# Patient Record
Sex: Female | Born: 1970
Health system: Southern US, Community
[De-identification: ages and names within clinical notes are randomized; demographics above are authoritative.]

## PROBLEM LIST (undated history)

## (undated) DIAGNOSIS — E079 Disorder of thyroid, unspecified: Secondary | ICD-10-CM

## (undated) DIAGNOSIS — G43909 Migraine, unspecified, not intractable, without status migrainosus: Secondary | ICD-10-CM

## (undated) DIAGNOSIS — F419 Anxiety disorder, unspecified: Secondary | ICD-10-CM

## (undated) HISTORY — PX: TONSILLECTOMY AND ADENOIDECTOMY: SHX28

## (undated) HISTORY — DX: Migraine, unspecified, not intractable, without status migrainosus: G43.909

## (undated) HISTORY — PX: CYST REMOVAL NECK: SHX6281

## (undated) HISTORY — PX: CHOLECYSTECTOMY: SHX55

## (undated) HISTORY — DX: Anxiety disorder, unspecified: F41.9

## (undated) HISTORY — PX: SKIN CANCER EXCISION: SHX779

## (undated) HISTORY — PX: PELVIC LAPAROSCOPY: SHX162

## (undated) HISTORY — DX: Disorder of thyroid, unspecified: E07.9

---

## 2014-03-23 ENCOUNTER — Other Ambulatory Visit: Payer: Self-pay | Admitting: Primary Care

## 2014-03-23 DIAGNOSIS — Z1231 Encounter for screening mammogram for malignant neoplasm of breast: Secondary | ICD-10-CM

## 2014-04-07 ENCOUNTER — Ambulatory Visit
Admission: RE | Admit: 2014-04-07 | Discharge: 2014-04-07 | Disposition: A | Discharge status: Home / Self Care | Payer: No Typology Code available for payment source | Source: Ambulatory Visit | Attending: Primary Care | Admitting: Primary Care

## 2014-04-07 DIAGNOSIS — Z1231 Encounter for screening mammogram for malignant neoplasm of breast: Secondary | ICD-10-CM

## 2014-04-10 ENCOUNTER — Other Ambulatory Visit: Payer: Self-pay | Admitting: Primary Care

## 2014-04-10 DIAGNOSIS — R928 Other abnormal and inconclusive findings on diagnostic imaging of breast: Secondary | ICD-10-CM

## 2014-04-22 ENCOUNTER — Ambulatory Visit
Admission: RE | Admit: 2014-04-22 | Discharge: 2014-04-22 | Disposition: A | Discharge status: Home / Self Care | Payer: No Typology Code available for payment source | Source: Ambulatory Visit | Attending: Primary Care | Admitting: Primary Care

## 2014-04-22 DIAGNOSIS — R928 Other abnormal and inconclusive findings on diagnostic imaging of breast: Secondary | ICD-10-CM

## 2014-09-17 ENCOUNTER — Other Ambulatory Visit: Payer: Self-pay | Admitting: Primary Care

## 2014-09-17 DIAGNOSIS — R921 Mammographic calcification found on diagnostic imaging of breast: Secondary | ICD-10-CM

## 2014-09-22 ENCOUNTER — Ambulatory Visit (INDEPENDENT_AMBULATORY_CARE_PROVIDER_SITE_OTHER): Payer: 59 | Admitting: Family Medicine

## 2014-09-22 VITALS — BP 122/70 | HR 84 | Temp 97.9°F | Resp 18 | Ht 64.0 in | Wt 173.0 lb

## 2014-09-22 DIAGNOSIS — H9192 Unspecified hearing loss, left ear: Secondary | ICD-10-CM

## 2014-09-22 DIAGNOSIS — H65192 Other acute nonsuppurative otitis media, left ear: Secondary | ICD-10-CM

## 2014-09-22 DIAGNOSIS — R42 Dizziness and giddiness: Secondary | ICD-10-CM

## 2014-09-22 MED ORDER — MECLIZINE HCL 25 MG PO TABS: 25.0000 mg | ORAL_TABLET | Freq: Three times a day (TID) | ORAL | Status: DC | PRN

## 2014-09-22 MED ORDER — AMOXICILLIN 875 MG PO TABS: 875.0000 mg | ORAL_TABLET | Freq: Two times a day (BID) | ORAL | Status: DC

## 2014-09-22 NOTE — Progress Notes (Signed)
° °  Subjective:  This chart was scribed for   by Randa Evens, ED Scribe. This Patient was seen in room 14 and the patients care was started at 4:09 PM   Patient ID: Cassandra Hughes, female    DOB: September 17, 1970, 44 y.o.   MRN: [id number]  Chief Complaint  Patient presents with   clogged ears    x3 days lt ear   Dizziness    x2 days    Emesis    today     HPI HPI Comments: [name] is a 44 y.o. female who presents to the [facility] complaining of new dizziness onset 1 day prior.  Pt states she has intermittent left ear that is" clogged up." Pt states she has some associated vomiting. Pt states that the dizziness is worse with movement. Pt doesn't report any medications PTA. Pt states she does have Hx of vertigo. Pt states that she has some slight cold symptoms but nothing major. Pt denies tinnitus or any other symptoms.   Customer service at [employer]  Review of Systems  HENT: Negative for ear discharge, ear pain and tinnitus.   Gastrointestinal: Positive for vomiting.  Neurological: Positive for dizziness.     Objective:   BP 122/70 mmHg   Pulse 84   Temp(Src) 97.9 F (36.6 C) (Oral)   Resp 18   Ht 5\' 4"  (1.626 m)   Wt 173 lb (78.472 kg)   BMI 29.68 kg/m2   SpO2 99%   LMP 09/20/2014   Physical Exam  Constitutional: She is oriented to person, place, and time. She appears well-developed and well-nourished. No distress.  HENT:  Head: Normocephalic and atraumatic.  Left TM amber and retracted.    Eyes: Conjunctivae and EOM are normal.  Neck: Neck supple. No tracheal deviation present.  Cardiovascular: Normal rate.   Pulmonary/Chest: Effort normal. No respiratory distress.  Musculoskeletal: Normal range of motion.  Neurological: She is alert and oriented to person, place, and time. Gait normal.  Skin: Skin is warm and dry.  Psychiatric: She has a normal mood and affect. Her behavior is normal.  Nursing note and vitals reviewed.   Assessment  & Plan:   I personally performed the services described in this documentation, which was scribed in my presence. The recorded information has been reviewed and is accurate. This chart was scribed in my presence and reviewed by me personally.    ICD-9-CM ICD-10-CM   1. Acute nonsuppurative otitis media of left ear 381.00 H65.192 amoxicillin (AMOXIL) 875 MG tablet  2. Hearing loss, left 389.9 H91.92 amoxicillin (AMOXIL) 875 MG tablet  3. Vertigo 780.4 R42 meclizine (ANTIVERT) 25 MG tablet     Signed, Robyn Haber, MD

## 2014-10-14 ENCOUNTER — Ambulatory Visit
Admission: RE | Admit: 2014-10-14 | Discharge: 2014-10-14 | Disposition: A | Discharge status: Home / Self Care | Payer: 59 | Source: Ambulatory Visit | Attending: Primary Care | Admitting: Primary Care

## 2014-10-14 DIAGNOSIS — R921 Mammographic calcification found on diagnostic imaging of breast: Secondary | ICD-10-CM

## 2015-03-17 ENCOUNTER — Other Ambulatory Visit: Payer: Self-pay

## 2015-03-17 ENCOUNTER — Other Ambulatory Visit: Payer: Self-pay | Admitting: Family Medicine

## 2015-03-17 DIAGNOSIS — R921 Mammographic calcification found on diagnostic imaging of breast: Secondary | ICD-10-CM

## 2015-04-09 ENCOUNTER — Ambulatory Visit
Admission: RE | Admit: 2015-04-09 | Discharge: 2015-04-09 | Disposition: A | Discharge status: Home / Self Care | Payer: 59 | Source: Ambulatory Visit

## 2015-04-09 DIAGNOSIS — R921 Mammographic calcification found on diagnostic imaging of breast: Secondary | ICD-10-CM

## 2015-04-11 ENCOUNTER — Telehealth: Payer: Self-pay | Admitting: Family Medicine

## 2015-04-11 DIAGNOSIS — E038 Other specified hypothyroidism: Secondary | ICD-10-CM

## 2015-04-11 NOTE — Telephone Encounter (Signed)
Patient returned call about some imaging results. Gave her this message from Dr. Joseph Art "Notes Recorded by Robyn Haber, MD on 04/09/2015 at 2:57 PM Please inform patient of normal result"   Patient understood. She also wants to get a referral to endocrinology.   [phone]

## 2015-04-12 NOTE — Telephone Encounter (Signed)
Ok for referral?

## 2015-04-12 NOTE — Addendum Note (Signed)
Addended by: Robyn Haber on: 04/12/2015 02:39 PM   Modules accepted: Orders

## 2015-04-20 ENCOUNTER — Telehealth: Payer: Self-pay

## 2015-04-20 NOTE — Telephone Encounter (Signed)
Pt LM about mammogram results. Looks like she's been told about these twice. LM letting her know it was normal and to CB with any questions.

## 2015-10-15 ENCOUNTER — Encounter: Payer: Self-pay | Admitting: Women's Health

## 2015-10-15 ENCOUNTER — Telehealth: Payer: Self-pay | Admitting: *Deleted

## 2015-10-15 ENCOUNTER — Ambulatory Visit (INDEPENDENT_AMBULATORY_CARE_PROVIDER_SITE_OTHER): Payer: BLUE CROSS/BLUE SHIELD | Admitting: Women's Health

## 2015-10-15 VITALS — BP 118/76 | Ht 64.0 in | Wt 174.0 lb

## 2015-10-15 DIAGNOSIS — Z01419 Encounter for gynecological examination (general) (routine) without abnormal findings: Secondary | ICD-10-CM

## 2015-10-15 DIAGNOSIS — Z833 Family history of diabetes mellitus: Secondary | ICD-10-CM | POA: Diagnosis not present

## 2015-10-15 DIAGNOSIS — N938 Other specified abnormal uterine and vaginal bleeding: Secondary | ICD-10-CM | POA: Diagnosis not present

## 2015-10-15 DIAGNOSIS — Z13220 Encounter for screening for lipoid disorders: Secondary | ICD-10-CM

## 2015-10-15 DIAGNOSIS — Z800 Family history of malignant neoplasm of digestive organs: Secondary | ICD-10-CM

## 2015-10-15 LAB — CBC WITH DIFFERENTIAL/PLATELET
Basophils Absolute: 0.1 10*3/uL (ref 0.0–0.1)
Basophils Relative: 3 % — ABNORMAL HIGH (ref 0–1)
EOS PCT: 4 % (ref 0–5)
Eosinophils Absolute: 0.2 10*3/uL (ref 0.0–0.7)
HCT: 35.8 % — ABNORMAL LOW (ref 36.0–46.0)
HEMOGLOBIN: 11.2 g/dL — AB (ref 12.0–15.0)
Lymphocytes Relative: 44 % (ref 12–46)
Lymphs Abs: 1.7 10*3/uL (ref 0.7–4.0)
MCH: 24.7 pg — AB (ref 26.0–34.0)
MCHC: 31.3 g/dL (ref 30.0–36.0)
MCV: 79 fL (ref 78.0–100.0)
MONOS PCT: 10 % (ref 3–12)
MPV: 10.1 fL (ref 8.6–12.4)
Monocytes Absolute: 0.4 10*3/uL (ref 0.1–1.0)
Neutro Abs: 1.5 10*3/uL — ABNORMAL LOW (ref 1.7–7.7)
Neutrophils Relative %: 39 % — ABNORMAL LOW (ref 43–77)
Platelets: 226 10*3/uL (ref 150–400)
RBC: 4.53 MIL/uL (ref 3.87–5.11)
RDW: 15.4 % (ref 11.5–15.5)
WBC: 3.8 10*3/uL — ABNORMAL LOW (ref 4.0–10.5)

## 2015-10-15 LAB — LIPID PANEL
CHOL/HDL RATIO: 4 ratio (ref <=5.0)
Cholesterol: 222 mg/dL — ABNORMAL HIGH (ref 125–200)
HDL: 55 mg/dL (ref >=46)
LDL Cholesterol: 151 mg/dL — ABNORMAL HIGH (ref <130)
Triglycerides: 80 mg/dL (ref <150)
VLDL: 16 mg/dL (ref <30)

## 2015-10-15 LAB — GLUCOSE, RANDOM: GLUCOSE: 84 mg/dL (ref 65–99)

## 2015-10-15 NOTE — Patient Instructions (Addendum)
hHealth Maintenance, Female Adopting a healthy lifestyle and getting preventive care can go a long way to promote health and wellness. Talk with your health care provider about what schedule of regular examinations is right for you. This is a good chance for you to check in with your provider about disease prevention and staying healthy. In between checkups, there are plenty of things you can do on your own. Experts have done a lot of research about which lifestyle changes and preventive measures are most likely to keep you healthy. Ask your health care provider for more information. WEIGHT AND DIET  Eat a healthy diet  Be sure to include plenty of vegetables, fruits, low-fat dairy products, and lean protein.  Do not eat a lot of foods high in solid fats, added sugars, or salt.  Get regular exercise. This is one of the most important things you can do for your health.  Most adults should exercise for at least 150 minutes each week. The exercise should increase your heart rate and make you sweat (moderate-intensity exercise).  Most adults should also do strengthening exercises at least twice a week. This is in addition to the moderate-intensity exercise.  Maintain a healthy weight  Body mass index (BMI) is a measurement that can be used to identify possible weight problems. It estimates body fat based on height and weight. Your health care provider can help determine your BMI and help you achieve or maintain a healthy weight.  For females 43 years of age and older:   A BMI below 18.5 is considered underweight.  A BMI of 18.5 to 24.9 is normal.  A BMI of 25 to 29.9 is considered overweight.  A BMI of 30 and above is considered obese.  Watch levels of cholesterol and blood lipids  You should start having your blood tested for lipids and cholesterol at 45 years of age, then have this test every 5 years.  You may need to have your cholesterol levels checked more often if:  Your lipid  or cholesterol levels are high.  You are older than 45 years of age.  You are at high risk for heart disease.  CANCER SCREENING   Lung Cancer  Lung cancer screening is recommended for adults 39-101 years old who are at high risk for lung cancer because of a history of smoking.  A yearly low-dose CT scan of the lungs is recommended for people who:  Currently smoke.  Have quit within the past 15 years.  Have at least a 30-pack-year history of smoking. A pack year is smoking an average of one pack of cigarettes a day for 1 year.  Yearly screening should continue until it has been 15 years since you quit.  Yearly screening should stop if you develop a health problem that would prevent you from having lung cancer treatment.  Breast Cancer  Practice breast self-awareness. This means understanding how your breasts normally appear and feel.  It also means doing regular breast self-exams. Let your health care provider know about any changes, no matter how small.  If you are in your 20s or 30s, you should have a clinical breast exam (CBE) by a health care provider every 1-3 years as part of a regular health exam.  If you are 5 or older, have a CBE every year. Also consider having a breast X-ray (mammogram) every year.  If you have a family history of breast cancer, talk to your health care provider about genetic screening.  If you  are at high risk for breast cancer, talk to your health care provider about having an MRI and a mammogram every year.  Breast cancer gene (BRCA) assessment is recommended for women who have family members with BRCA-related cancers. BRCA-related cancers include:  Breast.  Ovarian.  Tubal.  Peritoneal cancers.  Results of the assessment will determine the need for genetic counseling and BRCA1 and BRCA2 testing. Cervical Cancer Your health care provider may recommend that you be screened regularly for cancer of the pelvic organs (ovaries, uterus, and  vagina). This screening involves a pelvic examination, including checking for microscopic changes to the surface of your cervix (Pap test). You may be encouraged to have this screening done every 3 years, beginning at age 21.  For women ages 30-65, health care providers may recommend pelvic exams and Pap testing every 3 years, or they may recommend the Pap and pelvic exam, combined with testing for human papilloma virus (HPV), every 5 years. Some types of HPV increase your risk of cervical cancer. Testing for HPV may also be done on women of any age with unclear Pap test results.  Other health care providers may not recommend any screening for nonpregnant women who are considered low risk for pelvic cancer and who do not have symptoms. Ask your health care provider if a screening pelvic exam is right for you.  If you have had past treatment for cervical cancer or a condition that could lead to cancer, you need Pap tests and screening for cancer for at least 20 years after your treatment. If Pap tests have been discontinued, your risk factors (such as having a new sexual partner) need to be reassessed to determine if screening should resume. Some women have medical problems that increase the chance of getting cervical cancer. In these cases, your health care provider may recommend more frequent screening and Pap tests. Colorectal Cancer  This type of cancer can be detected and often prevented.  Routine colorectal cancer screening usually begins at 45 years of age and continues through 45 years of age.  Your health care provider may recommend screening at an earlier age if you have risk factors for colon cancer.  Your health care provider may also recommend using home test kits to check for hidden blood in the stool.  A small camera at the end of a tube can be used to examine your colon directly (sigmoidoscopy or colonoscopy). This is done to check for the earliest forms of colorectal  cancer.  Routine screening usually begins at age 50.  Direct examination of the colon should be repeated every 5-10 years through 45 years of age. However, you may need to be screened more often if early forms of precancerous polyps or small growths are found. Skin Cancer  Check your skin from head to toe regularly.  Tell your health care provider about any new moles or changes in moles, especially if there is a change in a mole's shape or color.  Also tell your health care provider if you have a mole that is larger than the size of a pencil eraser.  Always use sunscreen. Apply sunscreen liberally and repeatedly throughout the day.  Protect yourself by wearing long sleeves, pants, a wide-brimmed hat, and sunglasses whenever you are outside. HEART DISEASE, DIABETES, AND HIGH BLOOD PRESSURE   High blood pressure causes heart disease and increases the risk of stroke. High blood pressure is more likely to develop in:  People who have blood pressure in the high end   of the normal range (130-139/85-89 mm Hg).  People who are overweight or obese.  People who are African American.  If you are 38-23 years of age, have your blood pressure checked every 3-5 years. If you are 61 years of age or older, have your blood pressure checked every year. You should have your blood pressure measured twice--once when you are at a hospital or clinic, and once when you are not at a hospital or clinic. Record the average of the two measurements. To check your blood pressure when you are not at a hospital or clinic, you can use:  An automated blood pressure machine at a pharmacy.  A home blood pressure monitor.  If you are between 45 years and 39 years old, ask your health care provider if you should take aspirin to prevent strokes.  Have regular diabetes screenings. This involves taking a blood sample to check your fasting blood sugar level.  If you are at a normal weight and have a low risk for diabetes,  have this test once every three years after 45 years of age.  If you are overweight and have a high risk for diabetes, consider being tested at a younger age or more often. PREVENTING INFECTION  Hepatitis B  If you have a higher risk for hepatitis B, you should be screened for this virus. You are considered at high risk for hepatitis B if:  You were born in a country where hepatitis B is common. Ask your health care provider which countries are considered high risk.  Your parents were born in a high-risk country, and you have not been immunized against hepatitis B (hepatitis B vaccine).  You have HIV or AIDS.  You use needles to inject street drugs.  You live with someone who has hepatitis B.  You have had sex with someone who has hepatitis B.  You get hemodialysis treatment.  You take certain medicines for conditions, including cancer, organ transplantation, and autoimmune conditions. Hepatitis C  Blood testing is recommended for:  Everyone born from [birth year] through 1965.  Anyone with known risk factors for hepatitis C. Sexually transmitted infections (STIs)  You should be screened for sexually transmitted infections (STIs) including gonorrhea and chlamydia if:  You are sexually active and are younger than 45 years of age.  You are older than 45 years of age and your health care provider tells you that you are at risk for this type of infection.  Your sexual activity has changed since you were last screened and you are at an increased risk for chlamydia or gonorrhea. Ask your health care provider if you are at risk.  If you do not have HIV, but are at risk, it may be recommended that you take a prescription medicine daily to prevent HIV infection. This is called pre-exposure prophylaxis (PrEP). You are considered at risk if:  You are sexually active and do not regularly use condoms or know the HIV status of your partner(s).  You take drugs by injection.  You are sexually  active with a partner who has HIV. Talk with your health care provider about whether you are at high risk of being infected with HIV. If you choose to begin PrEP, you should first be tested for HIV. You should then be tested every 3 months for as long as you are taking PrEP.  PREGNANCY   If you are premenopausal and you may become pregnant, ask your health care provider about preconception counseling.  If you may  become pregnant, take 400 to 800 micrograms (mcg) of folic acid every day.  If you want to prevent pregnancy, talk to your health care provider about birth control (contraception). OSTEOPOROSIS AND MENOPAUSE   Osteoporosis is a disease in which the bones lose minerals and strength with aging. This can result in serious bone fractures. Your risk for osteoporosis can be identified using a bone density scan.  If you are 88 years of age or older, or if you are at risk for osteoporosis and fractures, ask your health care provider if you should be screened.  Ask your health care provider whether you should take a calcium or vitamin D supplement to lower your risk for osteoporosis.  Menopause may have certain physical symptoms and risks.  Hormone replacement therapy may reduce some of these symptoms and risks. Talk to your health care provider about whether hormone replacement therapy is right for you.  HOME CARE INSTRUCTIONS   Schedule regular health, dental, and eye exams.  Stay current with your immunizations.   Do not use any tobacco products including cigarettes, chewing tobacco, or electronic cigarettes.  If you are pregnant, do not drink alcohol.  If you are breastfeeding, limit how much and how often you drink alcohol.  Limit alcohol intake to no more than 1 drink per day for nonpregnant women. One drink equals 12 ounces of beer, 5 ounces of wine, or 1 ounces of hard liquor.  Do not use street drugs.  Do not share needles.  Ask your health care provider for help if  you need support or information about quitting drugs.  Tell your health care provider if you often feel depressed.  Tell your health care provider if you have ever been abused or do not feel safe at home.   This information is not intended to replace advice given to you by your health care provider. Make sure you discuss any questions you have with your health care provider.   Document Released: 02/13/2011 Document Revised: 08/21/2014 Document Reviewed: 07/02/2013 Elsevier Interactive Patient Education 2016 [employer]. Sonohysterogram A sonohysterogram is a procedure to examine the inside of your uterus. This exam uses sound waves sent to a computer to make real-time pictures of the inside of your uterus. To get the best images, a germ-free, saltwater solution (sterile saline) is injected into your uterus through your vagina. A sonohysterogram can show whether there is scarring or abnormal growths inside your uterus. It can also show whether your uterus is an abnormal shape or whether the lining is too thin.  LET [facility] CARE PROVIDER KNOW ABOUT:  Any allergies you have.  All medicines you are taking, including vitamins, herbs, eyedrops, creams, and over-the-counter medicines.  Previous problems you or members of your family have had with the use of anesthetics.  Any blood disorders you have.  Previous surgeries you have had.  Medical conditions you have.  The dates of your last period.  Possibility of pregnancy. RISKS AND COMPLICATIONS Generally, a sonohysterogram is a safe procedure. However, as with any procedure, problems can occur. Possible problems include:  Bleeding.  Infection. BEFORE THE PROCEDURE  Your health care provider may have you take an over-the-counter pain medicine.  You may get a prescription for antibiotic medicine.  Your health care provider may give you a pregnancy test before the procedure.  You will empty your bladder. PROCEDURE   You  will lie down on the examining table with your knees raised or your feet in stirrups.  Your  health care provider may do a pelvic exam before starting the procedure.  A slender, handheld device (transducer) will be lubricated and placed into your vagina.  The transducer will be positioned to send sound waves to your uterus.  The sound waves will bounce back to the transducer. They will be sent to a computer.  The computer will turn the sound waves into live images.  Your health care provider will view the images on a screen during the procedure.  Your health care provider will remove the transducer from your vagina and use an instrument to widen the opening (speculum).  A swab will be used to clean the opening to your uterus (cervix).  A long, thin tube (catheter) will then be placed through your cervix into your uterus.  Your health care provider will fill your uterus with sterile saline through the catheter. You may feel some cramping.  The speculum will be removed.  The transducer will be placed back in your vagina to take more images. AFTER THE PROCEDURE After the procedure, it is typical to have light bleeding from your vagina, cramping, and watery vaginal discharge.    This information is not intended to replace advice given to you by your health care provider. Make sure you discuss any questions you have with your health care provider.   Document Released: 12/15/2013 Document Reviewed: 12/15/2013 Elsevier Interactive Patient Education [employer].

## 2015-10-15 NOTE — Telephone Encounter (Signed)
-----   Message from Cassandra Cote, NP sent at 10/15/2015 10:02 AM EST ----- Anderson Malta, patient is off work on Monday, March 13, is there anyway we can get her scheduled for a surgical consult to discuss possible cholecystectomy, mother and maternal grandmother both died of gallbladder cancer mother age 45, maternal grandmother age 3.

## 2015-10-15 NOTE — Telephone Encounter (Signed)
Notes faxed to [facility], they will contact pt to schedule and fax me back with time and date.

## 2015-10-15 NOTE — Progress Notes (Signed)
Autumne Hair 31-Aug-1970 [id number]    History:    Presents for annual exam.  New patient with several issues. Monthly cycle first 2-3 days heavy and then light bleeding and spotting for 2 weeks after. Change within the last year prior to that monthly less than 7 day cycles. Has not been sexually active in greater than 5 years. Normal mammogram history. Reports positive high risk HPV any years ago, no needed treatment. Mother died from gallbladder cancer age 61, died within 74 months of diagnosis, maternal grandmother died from gallbladder cancer age 48.  Past medical history, past surgical history, family history and social history were all reviewed and documented in the EPIC chart. Works at [employer]. Has a 76-year-old son doing well. Father prostate cancer survivor. 2 sisters healthy. Moved here from [place].  ROS:  A ROS was performed and pertinent positives and negatives are included.  Exam:  Filed Vitals:   10/15/15 0854  BP: 118/76    General appearance:  Normal Thyroid:  Symmetrical, normal in size, without palpable masses or nodularity. Respiratory  Auscultation:  Clear without wheezing or rhonchi Cardiovascular  Auscultation:  Regular rate, without rubs, murmurs or gallops  Edema/varicosities:  Not grossly evident Abdominal  Soft,nontender, without masses, guarding or rebound.  Liver/spleen:  No organomegaly noted  Hernia:  None appreciated  Skin  Inspection:  Grossly normal   Breasts: Examined lying and sitting.     Right: Without masses, retractions, discharge or axillary adenopathy.     Left: Without masses, retractions, discharge or axillary adenopathy. Gentitourinary   Inguinal/mons:  Normal without inguinal adenopathy  External genitalia:  Normal  BUS/Urethra/Skene's glands:  Normal  Vagina:  Normal Brown discharge without odor or erythema  Cervix:  Normal  Uterus:  normal in size, shape and contour.  Midline and mobile  Adnexa/parametria:     Rt: Without  masses or tenderness.   Lt: Without masses or tenderness.  Anus and perineum: Normal  Digital rectal exam: Normal sphincter tone without palpated masses or tenderness  Assessment/Plan:  45 y.o. S WF G1 P1 for annual exam.    Cycles monthly for 2-3 days heavy flow and then light bleeding/spotting for 2 weeks after change in the past year Significant family history of gallbladder cancer mother and maternal grandmother Hypothyroidism-endocrinologist manages  Plan: Sonohysterogram with Dr. Toney Rakes, schedule after next cycle. SBE's, continue annual screening mammogram 3-D tomography reviewed and encouraged history of dense breasts. Exercise, calcium rich diet, vitamin D 1000 daily, decrease carbs in diet for weight loss encouraged. Will schedule surgical consult to discuss possible cholecystectomy due to strong family history. Ultrasound reviewed and declined need, condoms if active or return to office for management. CBC, lipid panel, glucose, UA, Pap with HR HPV typing, new screening guidelines reviewed.    Huel Cote [facility], 9:53 AM 10/15/2015

## 2015-10-18 LAB — PAP IG AND HPV HIGH-RISK: HPV DNA High Risk: NOT DETECTED

## 2015-10-22 NOTE — Telephone Encounter (Signed)
Appointment 10/25/15 @ 9:20am with Dr.Ramirez

## 2015-10-25 ENCOUNTER — Other Ambulatory Visit: Payer: Self-pay | Admitting: General Surgery

## 2015-10-25 DIAGNOSIS — Z800 Family history of malignant neoplasm of digestive organs: Secondary | ICD-10-CM

## 2015-11-03 ENCOUNTER — Ambulatory Visit
Admission: RE | Admit: 2015-11-03 | Discharge: 2015-11-03 | Disposition: A | Discharge status: Home / Self Care | Payer: BLUE CROSS/BLUE SHIELD | Source: Ambulatory Visit | Attending: General Surgery | Admitting: General Surgery

## 2015-11-03 DIAGNOSIS — Z800 Family history of malignant neoplasm of digestive organs: Secondary | ICD-10-CM

## 2016-01-13 HISTORY — PX: CHOLECYSTECTOMY, LAPAROSCOPIC: SHX56

## 2016-02-17 ENCOUNTER — Other Ambulatory Visit: Payer: Self-pay | Admitting: Gynecology

## 2016-02-17 DIAGNOSIS — N939 Abnormal uterine and vaginal bleeding, unspecified: Secondary | ICD-10-CM

## 2016-03-06 ENCOUNTER — Other Ambulatory Visit: Payer: Self-pay | Admitting: Women's Health

## 2016-03-06 DIAGNOSIS — R921 Mammographic calcification found on diagnostic imaging of breast: Secondary | ICD-10-CM

## 2016-03-20 ENCOUNTER — Ambulatory Visit: Payer: BLUE CROSS/BLUE SHIELD | Admitting: Gynecology

## 2016-03-20 ENCOUNTER — Other Ambulatory Visit: Payer: BLUE CROSS/BLUE SHIELD

## 2016-05-08 ENCOUNTER — Ambulatory Visit
Admission: RE | Admit: 2016-05-08 | Discharge: 2016-05-08 | Disposition: A | Discharge status: Home / Self Care | Payer: BLUE CROSS/BLUE SHIELD | Source: Ambulatory Visit | Attending: Women's Health | Admitting: Women's Health

## 2016-05-08 ENCOUNTER — Ambulatory Visit (INDEPENDENT_AMBULATORY_CARE_PROVIDER_SITE_OTHER): Payer: BLUE CROSS/BLUE SHIELD | Admitting: Family Medicine

## 2016-05-08 ENCOUNTER — Other Ambulatory Visit: Payer: Self-pay | Admitting: Women's Health

## 2016-05-08 ENCOUNTER — Encounter: Payer: Self-pay | Admitting: Women's Health

## 2016-05-08 VITALS — BP 120/72 | HR 70 | Temp 98.4°F | Resp 18 | Ht 64.0 in | Wt 182.0 lb

## 2016-05-08 DIAGNOSIS — R921 Mammographic calcification found on diagnostic imaging of breast: Secondary | ICD-10-CM

## 2016-05-08 DIAGNOSIS — R42 Dizziness and giddiness: Secondary | ICD-10-CM

## 2016-05-08 DIAGNOSIS — R51 Headache: Secondary | ICD-10-CM

## 2016-05-08 DIAGNOSIS — R519 Headache, unspecified: Secondary | ICD-10-CM

## 2016-05-08 MED ORDER — MECLIZINE HCL 25 MG PO TABS: 25.0000 mg | ORAL_TABLET | Freq: Three times a day (TID) | ORAL | 0 refills | Status: DC | PRN

## 2016-05-08 NOTE — Progress Notes (Signed)
Patient ID: Cassandra Hughes, female    DOB: 02-23-1971, 45 y.o.   MRN: [id number]  PCP: No primary care provider on file.  Chief Complaint  Patient presents with  . Dizziness    5 days    Subjective:   HPI 45 year old presents for evaluation of dizziness. She has experienced vertigo intermittently for several years. 1 week ago she sat up first thing in the morning and everything started spinning around and moving in the room. Positional movements causes sudden onset of spinning. Denies nausea or sensation of ear fullness.  She reports increase pressure around the frontal sinus region without runny nose. She has taken dramamine for motion sickness with minimal relief. She reports dramamine caused increase sleepiness the following day.  Also requests a referral to headache clinic related to long history of migraines that she reports has worsened over the last few years.  Social History   Social History  . Marital status: Single    Spouse name: N/A  . Number of children: N/A  . Years of education: N/A   Occupational History  . Not on file.   Social History Main Topics  . Smoking status: Never Smoker  . Smokeless tobacco: Never Used  . Alcohol use 0.0 oz/week     Comment: occ  . Drug use: No  . Sexual activity: No   Other Topics Concern  . Not on file   Social History Narrative  . No narrative on file   Family History  Problem Relation Age of Onset  . Cancer Mother     gallbladder   . Cancer Father     prostate  . Cancer Maternal Grandmother   . Heart disease Sister    Review of Systems  See HPI   Patient Active Problem List   Diagnosis Date Noted  . Family history of cancer of gallbladder 10/15/2015     Prior to Admission medications   Medication Sig Start Date End Date Taking? Authorizing Provider  levothyroxine (SYNTHROID, LEVOTHROID) 137 MCG tablet Take 137 mcg by mouth daily before breakfast.   Yes Historical Provider, MD  meclizine (ANTIVERT) 25  MG tablet Take 1 tablet (25 mg total) by mouth 3 (three) times daily as needed for dizziness. Patient not taking: Reported on 05/08/2016 09/22/14   Robyn Haber, MD    Allergies  Allergen Reactions  . Vicodin [Hydrocodone-Acetaminophen]       Objective:  Physical Exam  Constitutional: She is oriented to person, place, and time. She appears well-developed and well-nourished.  HENT:  Head: Normocephalic and atraumatic.  Right Ear: External ear normal.  Nose: Nose normal.  Mouth/Throat: Oropharynx is clear and moist.  Eyes: Conjunctivae and EOM are normal. Pupils are equal, round, and reactive to light.  Neck: Normal range of motion. Neck supple.  Cardiovascular: Normal rate, regular rhythm, normal heart sounds and intact distal pulses.   Pulmonary/Chest: Effort normal and breath sounds normal.  Musculoskeletal: Normal range of motion.  Neurological: She is alert and oriented to person, place, and time.  Positive Dix-Hallpike test. Negative nystagmus. Cerebellar function intact. Negative Romberg test.  Skin: Skin is warm and dry.  Psychiatric: She has a normal mood and affect. Her behavior is normal. Judgment and thought content normal.   Vitals:   05/08/16 1210  BP: 120/72  Pulse: 70  Resp: 18  Temp: 98.4 F (36.9 C)   Assessment & Plan:  1. Vertigo, likely benign positional vertigo Plan: - meclizine (ANTIVERT) 25 MG tablet; Take  1 tablet (25 mg total) by mouth 3 (three) times daily as needed for dizziness.  Dispense: 30 tablet; Refill: 0 - PT Vestibular rehabilitation   2. Worsening headaches, patient requested referral at the end of visit due to what she describes as more frequently occurring and increased intensity of headaches that she's experienced for several years. Plan: - AMB referral to headache clinic  Follow-up as needed.  Carroll Sage. Kenton Kingfisher, MSN, FNP-C [facility]

## 2016-05-08 NOTE — Patient Instructions (Addendum)
Start Meclizine  25 mg three times per day as needed for vertigo.  Start cetrizine 10 mg at bedtime as needed for head congestions.  Vestibular rehab referral ordered.  They will contact you to schedule an appointment.  IF you received an x-ray today, you will receive an invoice from [facility]. Please contact [facility] at [phone] with questions or concerns regarding your invoice.   IF you received labwork today, you will receive an invoice from [employer]. Please contact [facility] at [phone] with questions or concerns regarding your invoice.   Our billing staff will not be able to assist you with questions regarding bills from these companies.  You will be contacted with the lab results as soon as they are available. The fastest way to get your results is to activate your My Chart account. Instructions are located on the last page of this paperwork. If you have not heard from [place] regarding the results in 2 weeks, please contact this office.     Benign Positional Vertigo Vertigo is the feeling that you or your surroundings are moving when they are not. Benign positional vertigo is the most common form of vertigo. The cause of this condition is not serious (is benign). This condition is triggered by certain movements and positions (is positional). This condition can be dangerous if it occurs while you are doing something that could endanger you or others, such as driving.  CAUSES In many cases, the cause of this condition is not known. It may be caused by a disturbance in an area of the inner ear that helps your brain to sense movement and balance. This disturbance can be caused by a viral infection (labyrinthitis), head injury, or repetitive motion. RISK FACTORS This condition is more likely to develop in:  Women.  People who are 59 years of age or older. SYMPTOMS Symptoms of this condition usually happen when you move your head or  your eyes in different directions. Symptoms may start suddenly, and they usually last for less than a minute. Symptoms may include:  Loss of balance and falling.  Feeling like you are spinning or moving.  Feeling like your surroundings are spinning or moving.  Nausea and vomiting.  Blurred vision.  Dizziness.  Involuntary eye movement (nystagmus). Symptoms can be mild and cause only slight annoyance, or they can be severe and interfere with daily life. Episodes of benign positional vertigo may return (recur) over time, and they may be triggered by certain movements. Symptoms may improve over time. DIAGNOSIS This condition is usually diagnosed by medical history and a physical exam of the head, neck, and ears. You may be referred to a health care provider who specializes in ear, nose, and throat (ENT) problems (otolaryngologist) or a provider who specializes in disorders of the nervous system (neurologist). You may have additional testing, including:  MRI.  A CT scan.  Eye movement tests. Your health care provider may ask you to change positions quickly while he or she watches you for symptoms of benign positional vertigo, such as nystagmus. Eye movement may be tested with an electronystagmogram (ENG), caloric stimulation, the Dix-Hallpike test, or the roll test.  An electroencephalogram (EEG). This records electrical activity in your brain.  Hearing tests. TREATMENT Usually, your health care provider will treat this by moving your head in specific positions to adjust your inner ear back to normal. Surgery may be needed in severe cases, but this is rare. In some cases, benign positional vertigo may resolve  on its own in 2-4 weeks. HOME CARE INSTRUCTIONS Safety  Move slowly.Avoid sudden body or head movements.  Avoid driving.  Avoid operating heavy machinery.  Avoid doing any tasks that would be dangerous to you or others if a vertigo episode would occur.  If you have trouble  walking or keeping your balance, try using a cane for stability. If you feel dizzy or unstable, sit down right away.  Return to your normal activities as told by your health care provider. Ask your health care provider what activities are safe for you. General Instructions  Take over-the-counter and prescription medicines only as told by your health care provider.  Avoid certain positions or movements as told by your health care provider.  Drink enough fluid to keep your urine clear or pale yellow.  Keep all follow-up visits as told by your health care provider. This is important. SEEK MEDICAL CARE IF:  You have a fever.  Your condition gets worse or you develop new symptoms.  Your family or friends notice any behavioral changes.  Your nausea or vomiting gets worse.  You have numbness or a "pins and needles" sensation. SEEK IMMEDIATE MEDICAL CARE IF:  You have difficulty speaking or moving.  You are always dizzy.  You faint.  You develop severe headaches.  You have weakness in your legs or arms.  You have changes in your hearing or vision.  You develop a stiff neck.  You develop sensitivity to light.   This information is not intended to replace advice given to you by your health care provider. Make sure you discuss any questions you have with your health care provider.   Document Released: 05/08/2006 Document Revised: 04/21/2015 Document Reviewed: 11/23/2014 Elsevier Interactive Patient Education [employer].

## 2016-05-29 ENCOUNTER — Telehealth: Payer: Self-pay

## 2016-05-29 NOTE — Telephone Encounter (Signed)
I see a referral placed by kim on 9/25 and a note from [facility] "Sent to [facility]", is this the case?

## 2016-05-29 NOTE — Telephone Encounter (Signed)
PATIENT STATES SHE SAW KIMBERLY HARRIS ON SEPT. 25, 2017 FOR VERTIGO. SHE WAS GOING TO PUT IN A REFERRAL FOR HER TO GO TO VESTIBULAR REHAB, BUT SHE HAS NEVER HEARD ANYTHING BACK FROM [place]. (OUR REFERRAL DEPARTMENT CHECKED FOR THE ORDER AND IT WAS NEVER PUT INTO THE COMPUTER). PATIENT STATES SHE GOT THE REFERRAL FOR HER HEADACHES, BUT THE VERTIGO WAS THE INITIAL REASON SHE CAME HERE. BEST PHONE [phone] (CELL)  [place]

## 2016-05-30 NOTE — Telephone Encounter (Signed)
I will place a referral to vestibular rehab for the patient.  Thanks,  Molli Barrows, FNP-C

## 2016-05-30 NOTE — Telephone Encounter (Signed)
Yes, the headache clinic referral has been placed. We will need a new referral placed for vestibular rehab. Thanks!

## 2016-06-02 NOTE — Telephone Encounter (Signed)
Left detailed message for patient.

## 2016-06-19 ENCOUNTER — Ambulatory Visit (INDEPENDENT_AMBULATORY_CARE_PROVIDER_SITE_OTHER): Payer: BLUE CROSS/BLUE SHIELD | Admitting: Physician Assistant

## 2016-06-19 VITALS — BP 120/76 | HR 80 | Temp 98.4°F | Resp 16 | Ht 65.0 in | Wt 179.0 lb

## 2016-06-19 DIAGNOSIS — R42 Dizziness and giddiness: Secondary | ICD-10-CM

## 2016-06-19 MED ORDER — AZELASTINE HCL 0.15 % NA SOLN: 2.0000 | Freq: Two times a day (BID) | NASAL | 0 refills | Status: DC

## 2016-06-19 NOTE — Patient Instructions (Addendum)
   IF you received an x-ray today, you will receive an invoice from [facility]. Please contact [facility] at [phone] with questions or concerns regarding your invoice.   IF you received labwork today, you will receive an invoice from [facility]. Please contact [facility] at [phone] with questions or concerns regarding your invoice.   Our billing staff will not be able to assist you with questions regarding bills from these companies.  You will be contacted with the lab results as soon as they are available. The fastest way to get your results is to activate your My Chart account. Instructions are located on the last page of this paperwork. If you have not heard from us regarding the results in 2 weeks, please contact this office.     Epley Maneuver Self-Care WHAT IS THE EPLEY MANEUVER? The Epley maneuver is an exercise you can do to relieve symptoms of benign paroxysmal positional vertigo (BPPV). This condition is often just referred to as vertigo. BPPV is caused by the movement of tiny crystals (canaliths) inside your inner ear. The accumulation and movement of canaliths in your inner ear causes a sudden spinning sensation (vertigo) when you move your head to certain positions. Vertigo usually lasts about 30 seconds. BPPV usually occurs in just one ear. If you get vertigo when you lie on your left side, you probably have BPPV in your left ear. Your health care provider can tell you which ear is involved.  BPPV may be caused by a head injury. Many people older than 50 get BPPV for unknown reasons. If you have been diagnosed with BPPV, your health care provider may teach you how to do this maneuver. BPPV is not life threatening (benign) and usually goes away in time.  WHEN SHOULD I PERFORM THE EPLEY MANEUVER? You can do this maneuver at home whenever you have symptoms of vertigo. You may do the Epley maneuver up to 3 times a day until your  symptoms of vertigo go away. HOW SHOULD I DO THE EPLEY MANEUVER? 1. Sit on the edge of a bed or table with your back straight. Your legs should be extended or hanging over the edge of the bed or table.  2. Turn your head halfway toward the affected ear.  3. Lie backward quickly with your head turned until you are lying flat on your back. You may want to position a pillow under your shoulders.  4. Hold this position for 30 seconds. You may experience an attack of vertigo. This is normal. Hold this position until the vertigo stops. 5. Then turn your head to the opposite direction until your unaffected ear is facing the floor.  6. Hold this position for 30 seconds. You may experience an attack of vertigo. This is normal. Hold this position until the vertigo stops. 7. Now turn your whole body to the same side as your head. Hold for another 30 seconds.  8. You can then sit back up. ARE THERE RISKS TO THIS MANEUVER? In some cases, you may have other symptoms (such as changes in your vision, weakness, or numbness). If you have these symptoms, stop doing the maneuver and call your health care provider. Even if doing these maneuvers relieves your vertigo, you may still have dizziness. Dizziness is the sensation of light-headedness but without the sensation of movement. Even though the Epley maneuver may relieve your vertigo, it is possible that your symptoms will return within 5 years. WHAT SHOULD I DO AFTER THIS   MANEUVER? After doing the Epley maneuver, you can return to your normal activities. Ask your doctor if there is anything you should do at home to prevent vertigo. This may include:  Sleeping with two or more pillows to keep your head elevated.  Not sleeping on the side of your affected ear.  Getting up slowly from bed.  Avoiding sudden movements during the day.  Avoiding extreme head movement, like looking up or bending over.  Wearing a cervical collar to prevent sudden head  movements. WHAT SHOULD I DO IF MY SYMPTOMS GET WORSE? Call your health care provider if your vertigo gets worse. Call your provider right way if you have other symptoms, including:   Nausea.  Vomiting.  Headache.  Weakness.  Numbness.  Vision changes.   This information is not intended to replace advice given to you by your health care provider. Make sure you discuss any questions you have with your health care provider.   Document Released: 08/05/2013 Document Reviewed: 08/05/2013 Elsevier Interactive Patient Education 2016 Elsevier Inc.  

## 2016-06-19 NOTE — Progress Notes (Signed)
Subjective:    Patient ID: Cassandra Hughes, female    DOB: 03/01/1971, 45 y.o.   MRN: [id number]   Chief Complaint  Patient presents with  . Ear Problem    x 2-3 weeks  . Dizziness    x 1 month   HPI: Presents for dizziness which has been present for a little over 1 month. Patient was seen here 1 month ago for the dizziness and given Meclizine and a referral to vestibular PT which was never set up. She states her dizziness occurs when she is laying down, right when she sits up or if she is looking up for a long period of time. Describes it is like she is "on a tilt-a-whirl" and states it lasts ~5-10 seconds, occurring every single morning and night for the past month. Denies nausea, vomiting, pre-syncope, syncope, or lightheadedness. Denies loss of motor control. States she has heard some "crackling noises" in her ears the past few days but denies discharge from ears, fevers, chills, fatigue, or malaise. Notes she has had some allergies with the weather changing including a little bit of a sore throat, sinus pressure, and congestion last week but has since subsided. States she has only taken the Meclizine once since the last visit and it did not really work. Notes she has had this issue her "entire adult life" but the longest she had ever had it prior to this time was just a couple of days.  States she also received a referral for the headache clinic the last time she was here and started taking Zonisamide 2 weeks ago but had a 3-day migraine last week. States they told her it would take a couple of weeks before it started working and she is scheduled to follow-up with them for management of her migraines in the future.  Review of Systems  Constitutional: Negative for chills, fatigue and fever.  HENT: Positive for congestion and sinus pressure. Negative for ear discharge, ear pain, postnasal drip, rhinorrhea, sore throat and tinnitus.   Eyes: Negative for pain, discharge, redness and itching.    Respiratory: Negative for cough, chest tightness and shortness of breath.   Cardiovascular: Negative for chest pain and palpitations.  Gastrointestinal: Negative for abdominal pain, constipation, diarrhea, nausea and vomiting.  Genitourinary: Negative for difficulty urinating, dysuria, frequency, hematuria and urgency.  Neurological: Positive for dizziness and headaches.   Allergies  Allergen Reactions  . Vicodin [Hydrocodone-Acetaminophen]    Patient Active Problem List   Diagnosis Date Noted  . Family history of cancer of gallbladder 10/15/2015   Prior to Admission medications   Medication Sig Start Date End Date Taking? Authorizing Provider  levothyroxine (SYNTHROID, LEVOTHROID) 137 MCG tablet Take 137 mcg by mouth daily before breakfast.   Yes Historical Provider, MD  ZONISAMIDE PO Take by mouth.   Yes Historical Provider, MD  Azelastine HCl 0.15 % SOLN Place 2 sprays into both nostrils 2 (two) times daily. 06/19/16   Harrison Mons, PA-C        Objective:   Physical Exam  Constitutional: She is oriented to person, place, and time. She appears well-developed and well-nourished. No distress.  HENT:  Head: Normocephalic and atraumatic.  Right Ear: External ear normal. No drainage, swelling or tenderness. Tympanic membrane is not injected, not scarred, not perforated, not erythematous, not retracted and not bulging. No middle ear effusion. No decreased hearing is noted.  Left Ear: External ear normal. No drainage, swelling or tenderness. Tympanic membrane is not injected, not scarred, not  perforated, not erythematous, not retracted and not bulging.  No middle ear effusion. No decreased hearing is noted.  Nose: No mucosal edema, rhinorrhea, nose lacerations, sinus tenderness or septal deviation. Right sinus exhibits no maxillary sinus tenderness and no frontal sinus tenderness. Left sinus exhibits no maxillary sinus tenderness and no frontal sinus tenderness.  Mouth/Throat: Uvula is  midline and oropharynx is clear and moist. Mucous membranes are not pale, not dry and not cyanotic. No oral lesions. Normal dentition. No oropharyngeal exudate, posterior oropharyngeal edema, posterior oropharyngeal erythema or tonsillar abscesses.  Eyes: Conjunctivae and EOM are normal. Pupils are equal, round, and reactive to light. Right eye exhibits no discharge. Left eye exhibits no discharge. Right conjunctiva is not injected. Left conjunctiva is not injected. No scleral icterus. Right eye exhibits normal extraocular motion and no nystagmus. Left eye exhibits normal extraocular motion and no nystagmus. Right pupil is round and reactive. Left pupil is round and reactive. Pupils are equal.  Neck: Normal range of motion. Neck supple. No tracheal deviation present. No thyromegaly present.  Cardiovascular: Normal rate, regular rhythm, normal heart sounds and intact distal pulses.  Exam reveals no gallop and no friction rub.   No murmur heard. Pulmonary/Chest: Effort normal and breath sounds normal. No stridor. No respiratory distress. She has no wheezes. She has no rales.  Lymphadenopathy:    She has no cervical adenopathy.  Neurological: She is alert and oriented to person, place, and time. She has normal strength and normal reflexes. No cranial nerve deficit or sensory deficit. She displays a negative Romberg sign. Coordination and gait normal. GCS eye subscore is 4. GCS verbal subscore is 5. GCS motor subscore is 6.  Normal cerebellar testing -- finger to nose, heel to shin, and rapid alternating movements.   Skin: Skin is warm and dry. She is not diaphoretic.  Psychiatric: She has a normal mood and affect. Her behavior is normal.       Assessment & Plan:  1. Vertigo Recommended use of Azelastine HCl nasal spray as the dizziness could be related to ETD, although ears are clear upon exam and HEENT exam normal. Provided referral to vestibular PT and advised to RTC if symptoms are worsening or not  improving. Provided Epley maneuver information in AVS. - Ambulatory referral to Physical Therapy - Azelastine HCl 0.15 % SOLN; Place 2 sprays into both nostrils 2 (two) times daily.  Dispense: 30 mL; Refill: 0

## 2016-06-21 NOTE — Progress Notes (Signed)
Patient ID: Cassandra Hughes, female    DOB: [birth year], 45 y.o.   MRN: [id number]  PCP: Huel Cote, NP  Chief Complaint  Patient presents with  . Ear Problem    x 2-3 weeks  . Dizziness    x 1 month    Subjective:    HPI Presents for evaluation of persistent dizziness.  She presented here initially on 9/25 with 5 days of dizziness. She related intermittent dizziness for a number of years but never lasted more than a couple of days. That episode began when she sat up in bed and the room was spinning. The symptoms would resolve, but recur with rapid position changes. She had some increased frontal sinus pressure, but no runny nose, congestion, nausea, ear pain or fullness, tinnitus. OTC dramamine was not effective, but caused significant somnolence.  At that time, Dix-Halpike was positive. She had NO nystagmus. Negative Rhomberg. Remainder of neuro exam also normal. She was referred to neurology for management of progressively worsening migraine headaches, and to vestibular rehab for the vertigo. She has seen neurology for headaches and started on Zonegran. We do not have access to the notes from the [facility]. She never received a call from the vestibular rehabilitation office.  I see that she called here on 10/16, but I don't see that a new referral was placed.  Today she reports that the dizziness occurs with lying down, when she first sits up, or when her neck is extended for extended periods. The "tilt-a-whirl" sensation is brief, lasting only 5-10 seconds, but has occurred morning and night x 4 weeks. Meclizine prescribed 9/25 has not been helpful.  No nausea, vomiting, tinnitus. No weakness, paresthesias. No hearing loss. No visual changes.  With the recent weather changes, she's had some mild sore throat, sinus pressure and congestion, though it has resolved. She does have some intermittent crackling in the ears for the past several days.    Review of  Systems Constitutional: Negative for chills, fatigue and fever.  HENT: Positive for congestion and sinus pressure. Negative for ear discharge, ear pain, postnasal drip, rhinorrhea, sore throat and tinnitus.   Eyes: Negative for pain, discharge, redness and itching.  Respiratory: Negative for cough, chest tightness and shortness of breath.   Cardiovascular: Negative for chest pain and palpitations.  Gastrointestinal: Negative for abdominal pain, constipation, diarrhea, nausea and vomiting.  Genitourinary: Negative for difficulty urinating, dysuria, frequency, hematuria and urgency.  Neurological: Positive for dizziness and headaches.     Patient Active Problem List   Diagnosis Date Noted  . Family history of cancer of gallbladder 10/15/2015     Prior to Admission medications   Medication Sig Start Date End Date Taking? Authorizing Provider  levothyroxine (SYNTHROID, LEVOTHROID) 137 MCG tablet Take 137 mcg by mouth daily before breakfast.   Yes Historical Provider, MD  ZONISAMIDE PO Take by mouth.   Yes Historical Provider, MD     Allergies  Allergen Reactions  . Vicodin [Hydrocodone-Acetaminophen]        Objective:  Physical Exam  Constitutional: She is oriented to person, place, and time. She appears well-developed and well-nourished. She is active and cooperative. No distress.  BP 120/76 (BP Location: Right Arm, Patient Position: Sitting, Cuff Size: Normal)   Pulse 80   Temp 98.4 F (36.9 C) (Oral)   Resp 16   Ht 5\' 5"  (1.651 m)   Wt 179 lb (81.2 kg)   LMP 06/16/2016 (Exact Date)   SpO2 100%   BMI  29.79 kg/m   HENT:  Head: Normocephalic and atraumatic.  Right Ear: Hearing, tympanic membrane, external ear and ear canal normal.  Left Ear: Hearing, tympanic membrane, external ear and ear canal normal.  Nose: Nose normal.  Mouth/Throat: Oropharynx is clear and moist and mucous membranes are normal.  Eyes: Conjunctivae and EOM are normal. Pupils are equal, round, and  reactive to light. No scleral icterus.  Neck: Normal range of motion, full passive range of motion without pain and phonation normal. Neck supple. No thyromegaly present.  Cardiovascular: Normal rate, regular rhythm and normal heart sounds.   Pulses:      Radial pulses are 2+ on the right side, and 2+ on the left side.  Pulmonary/Chest: Effort normal and breath sounds normal.  Lymphadenopathy:       Head (right side): No tonsillar, no preauricular, no posterior auricular and no occipital adenopathy present.       Head (left side): No tonsillar, no preauricular, no posterior auricular and no occipital adenopathy present.    She has no cervical adenopathy.       Right: No supraclavicular adenopathy present.       Left: No supraclavicular adenopathy present.  Neurological: She is alert and oriented to person, place, and time. She has normal strength. No sensory deficit. She displays a negative Romberg sign. Coordination and gait normal.  Reflex Scores:      Bicep reflexes are 2+ on the right side and 2+ on the left side.      Patellar reflexes are 2+ on the right side and 2+ on the left side.      Achilles reflexes are 2+ on the right side and 2+ on the left side. Skin: Skin is warm, dry and intact. No rash noted. No cyanosis or erythema. Nails show no clubbing.  Psychiatric: She has a normal mood and affect. Her speech is normal and behavior is normal.           Assessment & Plan:   1. Vertigo Wonder if the vestibular rehab order Isn't working on the system side. Refer to PT. She can try home Epley maneuver. Trial of azelastine NS, in the event that she has some ETD that may be contributing. If no improvement, would request that her neurologist also address this issue. - Ambulatory referral to Physical Therapy - Azelastine HCl 0.15 % SOLN; Place 2 sprays into both nostrils 2 (two) times daily.  Dispense: 30 mL; Refill: 0   Fara Chute, PA-C Physician Assistant-Certified [facility]

## 2016-07-17 ENCOUNTER — Encounter: Payer: Self-pay | Admitting: Physical Therapy

## 2016-07-17 ENCOUNTER — Ambulatory Visit: Payer: BLUE CROSS/BLUE SHIELD | Attending: Physician Assistant | Admitting: Physical Therapy

## 2016-07-17 DIAGNOSIS — H8112 Benign paroxysmal vertigo, left ear: Secondary | ICD-10-CM | POA: Diagnosis not present

## 2016-07-17 DIAGNOSIS — R42 Dizziness and giddiness: Secondary | ICD-10-CM | POA: Diagnosis present

## 2016-07-18 NOTE — Therapy (Signed)
[facility] [address] [place], [place], [address] Phone: [phone]   Fax:  [phone]  Physical Therapy Evaluation  Patient Details  Name: Cassandra Hughes MRN: [id number] Date of Birth: 1970/11/05 Referring Provider: Harrison Mons, PA-C  Encounter Date: 07/17/2016      PT End of Session - 07/18/16 1512    Visit Number 1   Number of Visits 4   Authorization Type [employer]   PT Start Time [id number]   PT Stop Time 1531   PT Time Calculation (min) 44 min      Past Medical History:  Diagnosis Date  . Migraines   . Thyroid disease     Past Surgical History:  Procedure Laterality Date  . CESAREAN SECTION    . CHOLECYSTECTOMY, LAPAROSCOPIC  01/2016  . CYST REMOVAL NECK    . PELVIC LAPAROSCOPY     fibroid   . TONSILLECTOMY AND ADENOIDECTOMY      There were no vitals filed for this visit.       Subjective Assessment - 07/18/16 1346    Subjective Pt reports she gets dizzy when looking up at work and sometimes when looking down: pt reports dizziness started in middle of September; pt reports initial episode of vertigo occurred 12 years ago;  pt reports history of migraines                                                                                                                                                                     Pertinent History h/o migraines   Patient Stated Goals resolve the vertigo   Currently in Pain? No/denies            [facility] PT Assessment - 07/18/16 0001      Assessment   Medical Diagnosis Vertigo   Referring Provider Harrison Mons, PA-C   Onset Date/Surgical Date --  mid Sept. 2017     Precautions   Precautions Other (comment)  vertigo     Balance Screen   Has the patient fallen in the past 6 months No   Has the patient had a decrease in activity level because of a fear of falling?  No   Is the patient reluctant to leave their home because of a fear of falling?  No             Vestibular Assessment - 07/18/16 0001      Vestibular Assessment   General Observation Pt is a 45 year old lady with c/o spinning vertigo that occurs  intermittently with looking up and sometimes with looking down     Symptom Behavior   Type of Dizziness Spinning   Frequency of Dizziness varies - is intermittent in occurrence   Duration of Dizziness  seconds   Aggravating Factors Looking up to the ceiling;Comment  looking down   Relieving Factors Lying supine     Occulomotor Exam   Occulomotor Alignment Normal     Positional Testing   Dix-Hallpike Dix-Hallpike Right;Dix-Hallpike Left   Sidelying Test Sidelying Right;Sidelying Left     Dix-Hallpike Right   Dix-Hallpike Right Duration none     Dix-Hallpike Left   Dix-Hallpike Left Duration approx. 17 secs   Dix-Hallpike Left Symptoms Upbeat, left rotatory nystagmus     Sidelying Right   Sidelying Right Duration none   Sidelying Right Symptoms No nystagmus     Sidelying Left   Sidelying Left Duration min c/o vertigo but no nystagmus noted   Sidelying Left Symptoms No nystagmus        Epley Maneuver for L BPPV  - 4 reps total for L BPPV; symptoms improved on 4th rep with minimal nystagmus and minimal c/o vertigo               PT Education - 07/18/16 1511    Education provided Yes   Education Details Brandt-Daroff exercises   Person(s) Educated Patient   Methods Explanation;Demonstration;Handout   Comprehension Verbalized understanding;Returned demonstration             PT Long Term Goals - 07/18/16 1520      PT LONG TERM GOAL #1   Title Pt will have a (-) L Dix-Hallpike test to indicate resolution of L BPPV.  08-17-16   Time 4   Period Weeks   Status New     PT LONG TERM GOAL #2   Title Independent in HEP for Brandt-Daroff exercises prn.  08-17-16   Time 4   Period Weeks   Status New               Plan - 07/18/16 1514    Clinical Impression Statement Pt is a 45 year old  lady with signs and symptoms consistent with L BPPV with L rotary upbeating nystagmus.  Epley maneuver performed for 4 reps total with no nystagmus noted on 2nd rep, retested with sit to L sidelying with pt reporting vertigo in sidelying position; retested L Dix-Hallpike with intense upbeating nystagmus occurring on 3rd rep; symptoms improved significantly on 4th rep;     Rehab Potential Good   PT Frequency 1x / week   PT Duration 3 weeks   PT Treatment/Interventions ADLs/Self Care Home Management;Canalith Repostioning;Therapeutic activities;Therapeutic exercise;Balance training;Neuromuscular re-education;Patient/family education;Vestibular   PT Next Visit Plan reassess L BPPV; D/C if no vertigo   PT Home Exercise Plan Brandt-Daroff exercises   Consulted and Agree with Plan of Care Patient      Patient will benefit from skilled therapeutic intervention in order to improve the following deficits and impairments:  Dizziness  Visit Diagnosis: BPPV (benign paroxysmal positional vertigo), left - Plan: PT plan of care cert/re-cert  Dizziness and giddiness - Plan: PT plan of care cert/re-cert     Problem List Patient Active Problem List   Diagnosis Date Noted  . Family history of cancer of gallbladder 10/15/2015    Rushie Brazel, [place], PT 07/18/2016, 4:32 PM  [facility] [address] [place], [place], [address] Phone: [phone]   Fax:  [phone]  Name: Cassandra Hughes MRN: [id number] Date of Birth: [birth year]

## 2016-07-18 NOTE — Patient Instructions (Addendum)
Sit to Side-Lying    Sit on edge of bed. 1. Turn head 45 to right. 2. Maintain head position and lie down slowly on left side. Hold until symptoms subside. 3. Sit up slowly. Hold until symptoms subside. 4. Turn head 45 to left. 5. Maintain head position and lie down slowly on right side. Hold until symptoms subside. 6. Sit up slowly. Repeat sequence _5___ times per session. Do __2-3__ sessions per day.  Copyright  VHI. All rights reserved.   Benign Positional Vertigo Introduction Vertigo is the feeling that you or your surroundings are moving when they are not. Benign positional vertigo is the most common form of vertigo. The cause of this condition is not serious (is benign). This condition is triggered by certain movements and positions (is positional). This condition can be dangerous if it occurs while you are doing something that could endanger you or others, such as driving. What are the causes? In many cases, the cause of this condition is not known. It may be caused by a disturbance in an area of the inner ear that helps your brain to sense movement and balance. This disturbance can be caused by a viral infection (labyrinthitis), head injury, or repetitive motion. What increases the risk? This condition is more likely to develop in:  Women.  People who are 6 years of age or older. What are the signs or symptoms? Symptoms of this condition usually happen when you move your head or your eyes in different directions. Symptoms may start suddenly, and they usually last for less than a minute. Symptoms may include:  Loss of balance and falling.  Feeling like you are spinning or moving.  Feeling like your surroundings are spinning or moving.  Nausea and vomiting.  Blurred vision.  Dizziness.  Involuntary eye movement (nystagmus). Symptoms can be mild and cause only slight annoyance, or they can be severe and interfere with daily life. Episodes of benign positional vertigo may  return (recur) over time, and they may be triggered by certain movements. Symptoms may improve over time. How is this diagnosed? This condition is usually diagnosed by medical history and a physical exam of the head, neck, and ears. You may be referred to a health care provider who specializes in ear, nose, and throat (ENT) problems (otolaryngologist) or a provider who specializes in disorders of the nervous system (neurologist). You may have additional testing, including:  MRI.  A CT scan.  Eye movement tests. Your health care provider may ask you to change positions quickly while he or she watches you for symptoms of benign positional vertigo, such as nystagmus. Eye movement may be tested with an electronystagmogram (ENG), caloric stimulation, the Dix-Hallpike test, or the roll test.  An electroencephalogram (EEG). This records electrical activity in your brain.  Hearing tests. How is this treated? Usually, your health care provider will treat this by moving your head in specific positions to adjust your inner ear back to normal. Surgery may be needed in severe cases, but this is rare. In some cases, benign positional vertigo may resolve on its own in 2-4 weeks. Follow these instructions at home: Safety  Move slowly.Avoid sudden body or head movements.  Avoid driving.  Avoid operating heavy machinery.  Avoid doing any tasks that would be dangerous to you or others if a vertigo episode would occur.  If you have trouble walking or keeping your balance, try using a cane for stability. If you feel dizzy or unstable, sit down right away.  Return to your normal activities as told by your health care provider. Ask your health care provider what activities are safe for you. General instructions  Take over-the-counter and prescription medicines only as told by your health care provider.  Avoid certain positions or movements as told by your health care provider.  Drink enough fluid to keep  your urine clear or pale yellow.  Keep all follow-up visits as told by your health care provider. This is important. Contact a health care provider if:  You have a fever.  Your condition gets worse or you develop new symptoms.  Your family or friends notice any behavioral changes.  Your nausea or vomiting gets worse.  You have numbness or a "pins and needles" sensation. Get help right away if:  You have difficulty speaking or moving.  You are always dizzy.  You faint.  You develop severe headaches.  You have weakness in your legs or arms.  You have changes in your hearing or vision.  You develop a stiff neck.  You develop sensitivity to light. This information is not intended to replace advice given to you by your health care provider. Make sure you discuss any questions you have with your health care provider. Document Released: 05/08/2006 Document Revised: 01/06/2016 Document Reviewed: 11/23/2014  2017 [employer]vier

## 2016-08-08 ENCOUNTER — Telehealth: Payer: Self-pay | Admitting: Occupational Therapy

## 2016-08-09 ENCOUNTER — Ambulatory Visit: Payer: BLUE CROSS/BLUE SHIELD | Admitting: Physical Therapy

## 2016-10-17 ENCOUNTER — Encounter: Payer: Self-pay | Admitting: Women's Health

## 2016-10-17 ENCOUNTER — Ambulatory Visit (INDEPENDENT_AMBULATORY_CARE_PROVIDER_SITE_OTHER): Payer: BLUE CROSS/BLUE SHIELD | Admitting: Women's Health

## 2016-10-17 VITALS — BP 124/80 | Ht 63.75 in | Wt 181.0 lb

## 2016-10-17 DIAGNOSIS — Z01419 Encounter for gynecological examination (general) (routine) without abnormal findings: Secondary | ICD-10-CM

## 2016-10-17 DIAGNOSIS — Z9049 Acquired absence of other specified parts of digestive tract: Secondary | ICD-10-CM

## 2016-10-17 DIAGNOSIS — Z800 Family history of malignant neoplasm of digestive organs: Secondary | ICD-10-CM | POA: Diagnosis not present

## 2016-10-17 DIAGNOSIS — Z13220 Encounter for screening for lipoid disorders: Secondary | ICD-10-CM | POA: Diagnosis not present

## 2016-10-17 LAB — COMPREHENSIVE METABOLIC PANEL
ALBUMIN: 4.4 g/dL (ref 3.6–5.1)
ALT: 18 U/L (ref 6–29)
AST: 20 U/L (ref 10–35)
Alkaline Phosphatase: 70 U/L (ref 33–115)
BUN: 10 mg/dL (ref 7–25)
CO2: 25 mmol/L (ref 20–31)
CREATININE: 0.77 mg/dL (ref 0.50–1.10)
Calcium: 9.5 mg/dL (ref 8.6–10.2)
Chloride: 102 mmol/L (ref 98–110)
Glucose, Bld: 73 mg/dL (ref 65–99)
Potassium: 4.1 mmol/L (ref 3.5–5.3)
Sodium: 137 mmol/L (ref 135–146)
Total Bilirubin: 0.5 mg/dL (ref 0.2–1.2)
Total Protein: 6.8 g/dL (ref 6.1–8.1)

## 2016-10-17 LAB — CBC WITH DIFFERENTIAL/PLATELET
BASOS ABS: 110 {cells}/uL (ref 0–200)
Basophils Relative: 2 %
EOS PCT: 5 %
Eosinophils Absolute: 275 cells/uL (ref 15–500)
HCT: 43.3 % (ref 35.0–45.0)
Hemoglobin: 13.9 g/dL (ref 11.7–15.5)
LYMPHS PCT: 39 %
Lymphs Abs: 2145 cells/uL (ref 850–3900)
MCH: 28.2 pg (ref 27.0–33.0)
MCHC: 32.1 g/dL (ref 32.0–36.0)
MCV: 87.8 fL (ref 80.0–100.0)
MONOS PCT: 9 %
MPV: 10.4 fL (ref 7.5–12.5)
Monocytes Absolute: 495 cells/uL (ref 200–950)
NEUTROS PCT: 45 %
Neutro Abs: 2475 cells/uL (ref 1500–7800)
Platelets: 225 10*3/uL (ref 140–400)
RBC: 4.93 MIL/uL (ref 3.80–5.10)
RDW: 14.9 % (ref 11.0–15.0)
WBC: 5.5 10*3/uL (ref 3.8–10.8)

## 2016-10-17 LAB — LIPID PANEL
Cholesterol: 217 mg/dL — ABNORMAL HIGH (ref <200)
HDL: 53 mg/dL (ref >50)
LDL CALC: 139 mg/dL — AB (ref <100)
TRIGLYCERIDES: 126 mg/dL (ref <150)
Total CHOL/HDL Ratio: 4.1 Ratio (ref <5.0)
VLDL: 25 mg/dL (ref <30)

## 2016-10-17 NOTE — Patient Instructions (Signed)
Health Maintenance, Female Adopting a healthy lifestyle and getting preventive care can go a long way to promote health and wellness. Talk with your health care provider about what schedule of regular examinations is right for you. This is a good chance for you to check in with your provider about disease prevention and staying healthy. In between checkups, there are plenty of things you can do on your own. Experts have done a lot of research about which lifestyle changes and preventive measures are most likely to keep you healthy. Ask your health care provider for more information. Weight and diet Eat a healthy diet  Be sure to include plenty of vegetables, fruits, low-fat dairy products, and lean protein.  Do not eat a lot of foods high in solid fats, added sugars, or salt.  Get regular exercise. This is one of the most important things you can do for your health.  Most adults should exercise for at least 150 minutes each week. The exercise should increase your heart rate and make you sweat (moderate-intensity exercise).  Most adults should also do strengthening exercises at least twice a week. This is in addition to the moderate-intensity exercise. Maintain a healthy weight  Body mass index (BMI) is a measurement that can be used to identify possible weight problems. It estimates body fat based on height and weight. Your health care provider can help determine your BMI and help you achieve or maintain a healthy weight.  For females 76 years of age and older:  A BMI below 18.5 is considered underweight.  A BMI of 18.5 to 24.9 is normal.  A BMI of 25 to 29.9 is considered overweight.  A BMI of 30 and above is considered obese. Watch levels of cholesterol and blood lipids  You should start having your blood tested for lipids and cholesterol at 46 years of age, then have this test every 5 years.  You may need to have your cholesterol levels checked more often if:  Your lipid or  cholesterol levels are high.  You are older than 46 years of age.  You are at high risk for heart disease. Cancer screening Lung Cancer  Lung cancer screening is recommended for adults 64-42 years old who are at high risk for lung cancer because of a history of smoking.  A yearly low-dose CT scan of the lungs is recommended for people who:  Currently smoke.  Have quit within the past 15 years.  Have at least a 30-pack-year history of smoking. A pack year is smoking an average of one pack of cigarettes a day for 1 year.  Yearly screening should continue until it has been 15 years since you quit.  Yearly screening should stop if you develop a health problem that would prevent you from having lung cancer treatment. Breast Cancer  Practice breast self-awareness. This means understanding how your breasts normally appear and feel.  It also means doing regular breast self-exams. Let your health care provider know about any changes, no matter how small.  If you are in your 20s or 30s, you should have a clinical breast exam (CBE) by a health care provider every 1-3 years as part of a regular health exam.  If you are 34 or older, have a CBE every year. Also consider having a breast X-ray (mammogram) every year.  If you have a family history of breast cancer, talk to your health care provider about genetic screening.  If you are at high risk for breast cancer, talk  to your health care provider about having an MRI and a mammogram every year.  Breast cancer gene (BRCA) assessment is recommended for women who have family members with BRCA-related cancers. BRCA-related cancers include:  Breast.  Ovarian.  Tubal.  Peritoneal cancers.  Results of the assessment will determine the need for genetic counseling and BRCA1 and BRCA2 testing. Cervical Cancer  Your health care provider may recommend that you be screened regularly for cancer of the pelvic organs (ovaries, uterus, and vagina).  This screening involves a pelvic examination, including checking for microscopic changes to the surface of your cervix (Pap test). You may be encouraged to have this screening done every 3 years, beginning at age 24.  For women ages 66-65, health care providers may recommend pelvic exams and Pap testing every 3 years, or they may recommend the Pap and pelvic exam, combined with testing for human papilloma virus (HPV), every 5 years. Some types of HPV increase your risk of cervical cancer. Testing for HPV may also be done on women of any age with unclear Pap test results.  Other health care providers may not recommend any screening for nonpregnant women who are considered low risk for pelvic cancer and who do not have symptoms. Ask your health care provider if a screening pelvic exam is right for you.  If you have had past treatment for cervical cancer or a condition that could lead to cancer, you need Pap tests and screening for cancer for at least 20 years after your treatment. If Pap tests have been discontinued, your risk factors (such as having a new sexual partner) need to be reassessed to determine if screening should resume. Some women have medical problems that increase the chance of getting cervical cancer. In these cases, your health care provider may recommend more frequent screening and Pap tests. Colorectal Cancer  This type of cancer can be detected and often prevented.  Routine colorectal cancer screening usually begins at 46 years of age and continues through 46 years of age.  Your health care provider may recommend screening at an earlier age if you have risk factors for colon cancer.  Your health care provider may also recommend using home test kits to check for hidden blood in the stool.  A small camera at the end of a tube can be used to examine your colon directly (sigmoidoscopy or colonoscopy). This is done to check for the earliest forms of colorectal cancer.  Routine  screening usually begins at age 41.  Direct examination of the colon should be repeated every 5-10 years through 46 years of age. However, you may need to be screened more often if early forms of precancerous polyps or small growths are found. Skin Cancer  Check your skin from head to toe regularly.  Tell your health care provider about any new moles or changes in moles, especially if there is a change in a mole's shape or color.  Also tell your health care provider if you have a mole that is larger than the size of a pencil eraser.  Always use sunscreen. Apply sunscreen liberally and repeatedly throughout the day.  Protect yourself by wearing long sleeves, pants, a wide-brimmed hat, and sunglasses whenever you are outside. Heart disease, diabetes, and high blood pressure  High blood pressure causes heart disease and increases the risk of stroke. High blood pressure is more likely to develop in:  People who have blood pressure in the high end of the normal range (130-139/85-89 mm Hg).  People who are overweight or obese.  People who are African American.  If you are 59-24 years of age, have your blood pressure checked every 3-5 years. If you are 34 years of age or older, have your blood pressure checked every year. You should have your blood pressure measured twice-once when you are at a hospital or clinic, and once when you are not at a hospital or clinic. Record the average of the two measurements. To check your blood pressure when you are not at a hospital or clinic, you can use:  An automated blood pressure machine at a pharmacy.  A home blood pressure monitor.  If you are between 29 years and 60 years old, ask your health care provider if you should take aspirin to prevent strokes.  Have regular diabetes screenings. This involves taking a blood sample to check your fasting blood sugar level.  If you are at a normal weight and have a low risk for diabetes, have this test once  every three years after 46 years of age.  If you are overweight and have a high risk for diabetes, consider being tested at a younger age or more often. Preventing infection Hepatitis B  If you have a higher risk for hepatitis B, you should be screened for this virus. You are considered at high risk for hepatitis B if:  You were born in a country where hepatitis B is common. Ask your health care provider which countries are considered high risk.  Your parents were born in a high-risk country, and you have not been immunized against hepatitis B (hepatitis B vaccine).  You have HIV or AIDS.  You use needles to inject street drugs.  You live with someone who has hepatitis B.  You have had sex with someone who has hepatitis B.  You get hemodialysis treatment.  You take certain medicines for conditions, including cancer, organ transplantation, and autoimmune conditions. Hepatitis C  Blood testing is recommended for:  Everyone born from [birth year] through 1965.  Anyone with known risk factors for hepatitis C. Sexually transmitted infections (STIs)  You should be screened for sexually transmitted infections (STIs) including gonorrhea and chlamydia if:  You are sexually active and are younger than 46 years of age.  You are older than 46 years of age and your health care provider tells you that you are at risk for this type of infection.  Your sexual activity has changed since you were last screened and you are at an increased risk for chlamydia or gonorrhea. Ask your health care provider if you are at risk.  If you do not have HIV, but are at risk, it may be recommended that you take a prescription medicine daily to prevent HIV infection. This is called pre-exposure prophylaxis (PrEP). You are considered at risk if:  You are sexually active and do not regularly use condoms or know the HIV status of your partner(s).  You take drugs by injection.  You are sexually active with a partner  who has HIV. Talk with your health care provider about whether you are at high risk of being infected with HIV. If you choose to begin PrEP, you should first be tested for HIV. You should then be tested every 3 months for as long as you are taking PrEP. Pregnancy  If you are premenopausal and you may become pregnant, ask your health care provider about preconception counseling.  If you may become pregnant, take 400 to 800 micrograms (mcg) of folic acid  every day.  If you want to prevent pregnancy, talk to your health care provider about birth control (contraception). Osteoporosis and menopause  Osteoporosis is a disease in which the bones lose minerals and strength with aging. This can result in serious bone fractures. Your risk for osteoporosis can be identified using a bone density scan.  If you are 4 years of age or older, or if you are at risk for osteoporosis and fractures, ask your health care provider if you should be screened.  Ask your health care provider whether you should take a calcium or vitamin D supplement to lower your risk for osteoporosis.  Menopause may have certain physical symptoms and risks.  Hormone replacement therapy may reduce some of these symptoms and risks. Talk to your health care provider about whether hormone replacement therapy is right for you. Follow these instructions at home:  Schedule regular health, dental, and eye exams.  Stay current with your immunizations.  Do not use any tobacco products including cigarettes, chewing tobacco, or electronic cigarettes.  If you are pregnant, do not drink alcohol.  If you are breastfeeding, limit how much and how often you drink alcohol.  Limit alcohol intake to no more than 1 drink per day for nonpregnant women. One drink equals 12 ounces of beer, 5 ounces of wine, or 1 ounces of hard liquor.  Do not use street drugs.  Do not share needles.  Ask your health care provider for help if you need support  or information about quitting drugs.  Tell your health care provider if you often feel depressed.  Tell your health care provider if you have ever been abused or do not feel safe at home. This information is not intended to replace advice given to you by your health care provider. Make sure you discuss any questions you have with your health care provider. Document Released: 02/13/2011 Document Revised: 01/06/2016 Document Reviewed: 05/04/2015 Elsevier Interactive Patient Education  2017 [employer].

## 2016-10-17 NOTE — Progress Notes (Signed)
Cassandra Hughes 1970-11-23 [id number]    History:    Presents for annual exam. Monthly cycle first few days heavy  some months brown spotting after. Was having increased spotting last year. Has not been sexually active greater than 6 years. Cholecystectomy last year prophylactically, both mother and maternal grandmother died of gallbladder cancer. Normal Pap and mammogram history. Hypothyroidism endocrinologist manages.   Past medical history, past surgical history, family history and social history were all reviewed and documented in the EPIC chart. Works at [employer], also Geophysicist/field seismologist. Dorie Rank here from [place]. Originally from [place]. 50-year-old son.  ROS:  A ROS was performed and pertinent positives and negatives are included.  Exam:  Vitals:   10/17/16 1400  BP: 124/80  Weight: 181 lb (82.1 kg)  Height: 5' 3.75" (1.619 m)   Body mass index is 31.31 kg/m.   General appearance:  Normal Thyroid:  Symmetrical, normal in size, without palpable masses or nodularity. Respiratory  Auscultation:  Clear without wheezing or rhonchi Cardiovascular  Auscultation:  Regular rate, without rubs, murmurs or gallops  Edema/varicosities:  Not grossly evident Abdominal  Soft,nontender, without masses, guarding or rebound.  Liver/spleen:  No organomegaly noted  Hernia:  None appreciated  Skin  Inspection:  Grossly normal   Breasts: Examined lying and sitting.     Right: Without masses, retractions, discharge or axillary adenopathy.     Left: Without masses, retractions, discharge or axillary adenopathy. Gentitourinary   Inguinal/mons:  Normal without inguinal adenopathy  External genitalia:  Normal  BUS/Urethra/Skene's glands:  Normal  Vagina:  Normal  Cervix:  Normal  Uterus:   normal in size, shape and contour.  Midline and mobile  Adnexa/parametria:     Rt: Without masses or tenderness.   Lt: Without masses or tenderness.  Anus and perineum: Normal  Digital rectal  exam: Normal sphincter tone without palpated masses or tenderness  Assessment/Plan:  46 y.o. S WF G1 P1 for annual exam.    Monthly cycle/not sexually active/ spotting mid cycle Hypothyroid-endocrinologist manages  Plan: Sonohysterogram after next cycle. SBE's, continue annual 3-D screening mammogram history of dense breasts. Contraception options reviewed declines condoms if sexually active encouraged. Encouraged increased exercise, less calories for weight loss, vitamin D 1000 daily encouraged. CBC, lipid panel, CMP, Pap normal with negative HR HPV 2017, new screening guidelines reviewed.    [place], 5:15 PM 10/17/2016

## 2016-10-18 ENCOUNTER — Encounter: Payer: Self-pay | Admitting: Women's Health

## 2016-12-27 ENCOUNTER — Encounter: Payer: Self-pay | Admitting: Gynecology

## 2017-01-15 ENCOUNTER — Encounter: Payer: Self-pay | Admitting: Women's Health

## 2017-01-22 ENCOUNTER — Other Ambulatory Visit: Payer: Self-pay | Admitting: Women's Health

## 2017-01-22 DIAGNOSIS — N920 Excessive and frequent menstruation with regular cycle: Secondary | ICD-10-CM

## 2017-02-12 ENCOUNTER — Encounter: Payer: Self-pay | Admitting: Gynecology

## 2017-02-12 ENCOUNTER — Ambulatory Visit (INDEPENDENT_AMBULATORY_CARE_PROVIDER_SITE_OTHER): Payer: BLUE CROSS/BLUE SHIELD | Admitting: Gynecology

## 2017-02-12 ENCOUNTER — Ambulatory Visit (INDEPENDENT_AMBULATORY_CARE_PROVIDER_SITE_OTHER): Payer: BLUE CROSS/BLUE SHIELD

## 2017-02-12 ENCOUNTER — Other Ambulatory Visit: Payer: Self-pay | Admitting: Gynecology

## 2017-02-12 DIAGNOSIS — N939 Abnormal uterine and vaginal bleeding, unspecified: Secondary | ICD-10-CM

## 2017-02-12 DIAGNOSIS — N920 Excessive and frequent menstruation with regular cycle: Secondary | ICD-10-CM

## 2017-02-12 DIAGNOSIS — D251 Intramural leiomyoma of uterus: Secondary | ICD-10-CM

## 2017-02-12 DIAGNOSIS — N938 Other specified abnormal uterine and vaginal bleeding: Secondary | ICD-10-CM | POA: Insufficient documentation

## 2017-02-12 MED ORDER — LEVONORGESTREL-ETHINYL ESTRAD 0.1-20 MG-MCG PO TABS: 1.0000 | ORAL_TABLET | Freq: Every day | ORAL | 4 refills | Status: DC

## 2017-02-12 NOTE — Progress Notes (Signed)
   Patient is a 46 year old gravida 1 para 1 who is here today for endometrial biopsy as well as sonohysterogram as a result of patient's heavy menstrual cycle. She was seen by our nurse practitioner Elon Alas 10/17/2016 see previous note. Patient not sexually active uses condoms on a when necessary basis which she is sexually active. Patient had a normal Pap smear in 2017.her recent blood work indicated she had a normal CBC.  Patient was counseled for sonohysterogram and endometrial biopsy.  Ultrasound demonstrated the following: Uterus measured 10.3 x 5.5 x 4.0 cm with endometrial stripe is 7.7 mm. Patient with 3 fibroids the largest one measuring 15 x 13 mm intramural one was calcified. Right ovary with several follicles 15 x 14 x 18 mm and 13 x 16 mm. Left ovary was normal. Afterwards the cervix was cleansed with a non-solution and sterile catheter was introduced into the uterine cavity and normal saline was instilled.no intracavitary defects were seen.  Following this the cervix was once again cleansed with Betadine solution and a sterile Pipelle was introduced into the uterine cavity and tissue was obtained for biopsy and submitted for histological evaluation. Patient tolerated procedure well she was given Aleve for some cramping.  Assessment/plan: Patient with menorrhagia sometimes irregular. Endometrial biopsy done today result pending at time of this dictation. Patient was offered different treatment options to include a trial of oral contraceptive pills for 6 months to a year and if symptoms continue she would return to the office for reevaluation and consideration of laparoscopic tubal ligation and concurrently to do an endometrial ablation such as with NovaSure. Literature information was provided all the above. She'll be started on avian 20 g oral contraceptive pill.

## 2017-02-12 NOTE — Patient Instructions (Signed)
Oral Contraception Information Oral contraceptive pills (OCPs) are medicines taken to prevent pregnancy. OCPs work by preventing the ovaries from releasing eggs. The hormones in OCPs also cause the cervical mucus to thicken, preventing the sperm from entering the uterus. The hormones also cause the uterine lining to become thin, not allowing a fertilized egg to attach to the inside of the uterus. OCPs are highly effective when taken exactly as prescribed. However, OCPs do not prevent sexually transmitted diseases (STDs). Safe sex practices, such as using condoms along with the pill, can help prevent STDs. Before taking the pill, you may have a physical exam and Pap test. Your health care provider may order blood tests. The health care provider will make sure you are a good candidate for oral contraception. Discuss with your health care provider the possible side effects of the OCP you may be prescribed. When starting an OCP, it can take 2 to 3 months for the body to adjust to the changes in hormone levels in your body. Types of oral contraception  The combination pill-This pill contains estrogen and progestin (synthetic progesterone) hormones. The combination pill comes in 21-day, 28-day, or 91-day packs. Some types of combination pills are meant to be taken continuously (365-day pills). With 21-day packs, you do not take pills for 7 days after the last pill. With 28-day packs, the pill is taken every day. The last 7 pills are without hormones. Certain types of pills have more than 21 hormone-containing pills. With 91-day packs, the first 84 pills contain both hormones, and the last 7 pills contain no hormones or contain estrogen only.  The minipill-This pill contains the progesterone hormone only. The pill is taken every day continuously. It is very important to take the pill at the same time each day. The minipill comes in packs of 28 pills. All 28 pills contain the hormone. Advantages of oral  contraceptive pills  Decreases premenstrual symptoms.  Treats menstrual period cramps.  Regulates the menstrual cycle.  Decreases a heavy menstrual flow.  May treatacne, depending on the type of pill.  Treats abnormal uterine bleeding.  Treats polycystic ovarian syndrome.  Treats endometriosis.  Can be used as emergency contraception. Things that can make oral contraceptive pills less effective OCPs can be less effective if:  You forget to take the pill at the same time every day.  You have a stomach or intestinal disease that lessens the absorption of the pill.  You take OCPs with other medicines that make OCPs less effective, such as antibiotics, certain HIV medicines, and some seizure medicines.  You take expired OCPs.  You forget to restart the pill on day 7, when using the packs of 21 pills.  Risks associated with oral contraceptive pills Oral contraceptive pills can sometimes cause side effects, such as:  Headache.  Nausea.  Breast tenderness.  Irregular bleeding or spotting.  Combination pills are also associated with a small increased risk of:  Blood clots.  Heart attack.  Stroke.  This information is not intended to replace advice given to you by your health care provider. Make sure you discuss any questions you have with your health care provider. Document Released: 10/21/2002 Document Revised: 01/06/2016 Document Reviewed: 01/19/2013 Elsevier Interactive Patient Education  2018 [employer]. Endometrial Ablation Endometrial ablation is a procedure that destroys the thin inner layer of the lining of the uterus (endometrium). This procedure may be done:  To stop heavy periods.  To stop bleeding that is causing anemia.  To control irregular  bleeding.  To treat bleeding caused by small tumors (fibroids) in the endometrium.  This procedure is often an alternative to major surgery, such as removal of the uterus and cervix (hysterectomy). As a  result of this procedure:  You may not be able to have children. However, if you are premenopausal (you have not gone through menopause): ? You may still have a small chance of getting pregnant. ? You will need to use a reliable method of birth control after the procedure to prevent pregnancy.  You may stop having a menstrual period, or you may have only a small amount of bleeding during your period. Menstruation may return several years after the procedure.  Tell a health care provider about:  Any allergies you have.  All medicines you are taking, including vitamins, herbs, eye drops, creams, and over-the-counter medicines.  Any problems you or family members have had with the use of anesthetic medicines.  Any blood disorders you have.  Any surgeries you have had.  Any medical conditions you have. What are the risks? Generally, this is a safe procedure. However, problems may occur, including:  A hole (perforation) in the uterus or bowel.  Infection of the uterus, bladder, or vagina.  Bleeding.  Damage to other structures or organs.  An air bubble in the lung (air embolus).  Problems with pregnancy after the procedure.  Failure of the procedure.  Decreased ability to diagnose cancer in the endometrium.  What happens before the procedure?  You will have tests of your endometrium to make sure there are no pre-cancerous cells or cancer cells present.  You may have an ultrasound of the uterus.  You may be given medicines to thin the endometrium.  Ask your health care provider about: ? Changing or stopping your regular medicines. This is especially important if you take diabetes medicines or blood thinners. ? Taking medicines such as aspirin and ibuprofen. These medicines can thin your blood. Do not take these medicines before your procedure if your doctor tells you not to.  Plan to have someone take you home from the hospital or clinic. What happens during the  procedure?  You will lie on an exam table with your feet and legs supported as in a pelvic exam.  To lower your risk of infection: ? Your health care team will wash or sanitize their hands and put on germ-free (sterile) gloves. ? Your genital area will be washed with soap.  An IV tube will be inserted into one of your veins.  You will be given a medicine to help you relax (sedative).  A surgical instrument with a light and camera (resectoscope) will be inserted into your vagina and moved into your uterus. This allows your surgeon to see inside your uterus.  Endometrial tissue will be removed using one of the following methods: ? Radiofrequency. This method uses a radiofrequency-alternating electric current to remove the endometrium. ? Cryotherapy. This method uses extreme cold to freeze the endometrium. ? Heated-free liquid. This method uses a heated saltwater (saline) solution to remove the endometrium. ? Microwave. This method uses high-energy microwaves to heat up the endometrium and remove it. ? Thermal balloon. This method involves inserting a catheter with a balloon tip into the uterus. The balloon tip is filled with heated fluid to remove the endometrium. The procedure may vary among health care providers and hospitals. What happens after the procedure?  Your blood pressure, heart rate, breathing rate, and blood oxygen level will be monitored until the medicines  you were given have worn off.  As tissue healing occurs, you may notice vaginal bleeding for 4-6 weeks after the procedure. You may also experience: ? Cramps. ? Thin, watery vaginal discharge that is light pink or brown in color. ? A need to urinate more frequently than usual. ? Nausea.  Do not drive for 24 hours if you were given a sedative.  Do not have sex or insert anything into your vagina until your health care provider approves. Summary  Endometrial ablation is done to treat the many causes of heavy menstrual  bleeding.  The procedure may be done only after medications have been tried to control the bleeding.  Plan to have someone take you home from the hospital or clinic. This information is not intended to replace advice given to you by your health care provider. Make sure you discuss any questions you have with your health care provider. Document Released: 06/09/2004 Document Revised: 08/17/2016 Document Reviewed: 08/17/2016 Elsevier Interactive Patient Education  2017 [employer].

## 2017-03-01 ENCOUNTER — Telehealth: Payer: Self-pay

## 2017-03-01 NOTE — Telephone Encounter (Signed)
I called patient because I received message that she had not read the My Chart email I sent her informing her of normal endo biopsy results.  Patient said she had received the message and not sure why it came back to me as unread.

## 2017-06-11 ENCOUNTER — Other Ambulatory Visit: Payer: Self-pay | Admitting: Women's Health

## 2017-06-11 DIAGNOSIS — Z1231 Encounter for screening mammogram for malignant neoplasm of breast: Secondary | ICD-10-CM

## 2017-07-11 ENCOUNTER — Ambulatory Visit
Admission: RE | Admit: 2017-07-11 | Discharge: 2017-07-11 | Disposition: A | Discharge status: Home / Self Care | Payer: BLUE CROSS/BLUE SHIELD | Source: Ambulatory Visit | Attending: Women's Health | Admitting: Women's Health

## 2017-07-11 DIAGNOSIS — Z1231 Encounter for screening mammogram for malignant neoplasm of breast: Secondary | ICD-10-CM

## 2017-07-12 ENCOUNTER — Encounter: Payer: Self-pay | Admitting: Women's Health

## 2017-11-07 ENCOUNTER — Ambulatory Visit (INDEPENDENT_AMBULATORY_CARE_PROVIDER_SITE_OTHER): Payer: BLUE CROSS/BLUE SHIELD | Admitting: Women's Health

## 2017-11-07 ENCOUNTER — Encounter: Payer: Self-pay | Admitting: Women's Health

## 2017-11-07 VITALS — BP 128/80 | Ht 63.0 in | Wt 187.0 lb

## 2017-11-07 DIAGNOSIS — Z01419 Encounter for gynecological examination (general) (routine) without abnormal findings: Secondary | ICD-10-CM

## 2017-11-07 DIAGNOSIS — Z13220 Encounter for screening for lipoid disorders: Secondary | ICD-10-CM | POA: Diagnosis not present

## 2017-11-07 MED ORDER — LEVONORGESTREL-ETHINYL ESTRAD 0.1-20 MG-MCG PO TABS: 1.0000 | ORAL_TABLET | Freq: Every day | ORAL | 4 refills | Status: DC

## 2017-11-07 NOTE — Progress Notes (Signed)
Cassandra Hughes [birth year] [id number]    History:    Presents for annual exam.  Amenorrheic on Aviane continuously without complaint.  History of an abnormal Pap 10 years ago with LEEP in [place] and normal Paps after.  Normal mammogram history.  Hypothyroidism, endocrinologist manages.  History of 3 small fibroids.  Not sexually active, denies need for STD screen.  Past medical history, past surgical history, family history and social history were all reviewed and documented in the EPIC chart.  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  2017 cholecystectomy, mother and maternal grandmother gallbladder cancer.  ROS:  A ROS was performed and pertinent positives and negatives are included.  Exam:  Vitals:   11/07/17 0854  BP: 128/80  Weight: 187 lb (84.8 kg)  Height: 5\' 3"  (1.6 m)   Body mass index is 33.13 kg/m.   General appearance:  Normal Thyroid:  Symmetrical, normal in size, without palpable masses or nodularity. Respiratory  Auscultation:  Clear without wheezing or rhonchi Cardiovascular  Auscultation:  Regular rate, without rubs, murmurs or gallops  Edema/varicosities:  Not grossly evident Abdominal  Soft,nontender, without masses, guarding or rebound.  Liver/spleen:  No organomegaly noted  Hernia:  None appreciated  Skin  Inspection:  Grossly normal   Breasts: Examined lying and sitting.     Right: Without masses, retractions, discharge or axillary adenopathy.     Left: Without masses, retractions, discharge or axillary adenopathy. Gentitourinary   Inguinal/mons:  Normal without inguinal adenopathy  External genitalia:  Normal  BUS/Urethra/Skene's glands:  Normal  Vagina:  Normal  Cervix:  Normal  Uterus:  normal in size, shape and contour.  Midline and mobile  Adnexa/parametria:     Rt: Without masses or tenderness.   Lt: Without masses or tenderness.  Anus and perineum: Normal  Digital rectal exam: Normal sphincter tone without palpated masses or  tenderness  Assessment/Plan:  47 y.o. S WF G1P1 for annual exam with no complaints.  Amenorrheic on Aviane continuously Hypothyroidism-Dr. Chalmers Cater manages Abnormal Pap greater than 10 years ago LEEP normal Paps after  Plan: Aviane prescription, proper use, slight risk for blood clots and strokes reviewed, SBE's, continue annual screening mammogram, calcium rich foods, vitamin D 1000 daily encouraged.  Reviewed importance of regular exercise, decreasing calories has had 7 pound weight gain.  CBC, glucose, lipid panel, Pap with HR HPV typing, new screening guidelines reviewed.    [place], 9:54 AM 11/07/2017

## 2017-11-07 NOTE — Patient Instructions (Signed)

## 2017-11-08 LAB — PAP, TP IMAGING W/ HPV RNA, RFLX HPV TYPE 16,18/45: HPV DNA High Risk: NOT DETECTED

## 2017-11-08 LAB — GLUCOSE, RANDOM: GLUCOSE: 84 mg/dL (ref 65–99)

## 2017-11-08 LAB — CBC WITH DIFFERENTIAL/PLATELET
Basophils Absolute: 51 cells/uL (ref 0–200)
Basophils Relative: 1.1 %
EOS PCT: 3.7 %
Eosinophils Absolute: 170 cells/uL (ref 15–500)
HCT: 42.8 % (ref 35.0–45.0)
Hemoglobin: 14.6 g/dL (ref 11.7–15.5)
Lymphs Abs: 1674 cells/uL (ref 850–3900)
MCH: 31.1 pg (ref 27.0–33.0)
MCHC: 34.1 g/dL (ref 32.0–36.0)
MCV: 91.3 fL (ref 80.0–100.0)
MONOS PCT: 7.2 %
MPV: 11.2 fL (ref 7.5–12.5)
Neutro Abs: 2374 cells/uL (ref 1500–7800)
Neutrophils Relative %: 51.6 %
PLATELETS: 199 10*3/uL (ref 140–400)
RBC: 4.69 10*6/uL (ref 3.80–5.10)
RDW: 12 % (ref 11.0–15.0)
Total Lymphocyte: 36.4 %
WBC mixed population: 331 cells/uL (ref 200–950)
WBC: 4.6 10*3/uL (ref 3.8–10.8)

## 2017-11-08 LAB — LIPID PANEL
Cholesterol: 203 mg/dL — ABNORMAL HIGH (ref <200)
HDL: 46 mg/dL — ABNORMAL LOW (ref >50)
LDL CHOLESTEROL (CALC): 135 mg/dL — AB
Non-HDL Cholesterol (Calc): 157 mg/dL (calc) — ABNORMAL HIGH (ref <130)
TRIGLYCERIDES: 108 mg/dL (ref <150)
Total CHOL/HDL Ratio: 4.4 (calc) (ref <5.0)

## 2018-02-13 ENCOUNTER — Other Ambulatory Visit: Payer: Self-pay

## 2018-02-13 MED ORDER — LEVONORGESTREL-ETHINYL ESTRAD 0.1-20 MG-MCG PO TABS: 1.0000 | ORAL_TABLET | Freq: Every day | ORAL | 2 refills | Status: DC

## 2018-09-23 ENCOUNTER — Other Ambulatory Visit: Payer: Self-pay | Admitting: Women's Health

## 2018-09-23 DIAGNOSIS — Z1231 Encounter for screening mammogram for malignant neoplasm of breast: Secondary | ICD-10-CM

## 2018-11-04 ENCOUNTER — Ambulatory Visit: Payer: BLUE CROSS/BLUE SHIELD

## 2018-11-06 ENCOUNTER — Encounter: Payer: Self-pay | Admitting: Women's Health

## 2018-11-13 ENCOUNTER — Encounter: Payer: BLUE CROSS/BLUE SHIELD | Admitting: Women's Health

## 2018-12-16 ENCOUNTER — Ambulatory Visit: Payer: BLUE CROSS/BLUE SHIELD

## 2019-01-04 ENCOUNTER — Other Ambulatory Visit: Payer: Self-pay | Admitting: Women's Health

## 2019-01-07 NOTE — Telephone Encounter (Signed)
annual scheduled on 01/27/19

## 2019-01-27 ENCOUNTER — Ambulatory Visit: Payer: BLUE CROSS/BLUE SHIELD

## 2019-01-27 ENCOUNTER — Encounter: Payer: BLUE CROSS/BLUE SHIELD | Admitting: Women's Health

## 2019-03-03 ENCOUNTER — Other Ambulatory Visit: Payer: Self-pay

## 2019-03-04 ENCOUNTER — Encounter: Payer: Self-pay | Admitting: Women's Health

## 2019-03-04 ENCOUNTER — Ambulatory Visit: Payer: BLUE CROSS/BLUE SHIELD | Admitting: Women's Health

## 2019-03-04 VITALS — BP 128/80 | Ht 63.0 in | Wt 193.0 lb

## 2019-03-04 DIAGNOSIS — Z3041 Encounter for surveillance of contraceptive pills: Secondary | ICD-10-CM | POA: Diagnosis not present

## 2019-03-04 DIAGNOSIS — N898 Other specified noninflammatory disorders of vagina: Secondary | ICD-10-CM

## 2019-03-04 DIAGNOSIS — Z01419 Encounter for gynecological examination (general) (routine) without abnormal findings: Secondary | ICD-10-CM | POA: Diagnosis not present

## 2019-03-04 DIAGNOSIS — Z13220 Encounter for screening for lipoid disorders: Secondary | ICD-10-CM

## 2019-03-04 LAB — WET PREP FOR TRICH, YEAST, CLUE

## 2019-03-04 MED ORDER — LEVONORGESTREL-ETHINYL ESTRAD 0.1-20 MG-MCG PO TABS: ORAL_TABLET | ORAL | 4 refills | Status: DC

## 2019-03-04 MED ORDER — FLUCONAZOLE 150 MG PO TABS: 150.0000 mg | ORAL_TABLET | Freq: Once | ORAL | 1 refills | Status: AC

## 2019-03-04 NOTE — Patient Instructions (Signed)
Health Maintenance, Female Adopting a healthy lifestyle and getting preventive care are important in promoting health and wellness. Ask your health care provider about:  The right schedule for you to have regular tests and exams.  Things you can do on your own to prevent diseases and keep yourself healthy. What should I know about diet, weight, and exercise? Eat a healthy diet   Eat a diet that includes plenty of vegetables, fruits, low-fat dairy products, and lean protein.  Do not eat a lot of foods that are high in solid fats, added sugars, or sodium. Maintain a healthy weight Body mass index (BMI) is used to identify weight problems. It estimates body fat based on height and weight. Your health care provider can help determine your BMI and help you achieve or maintain a healthy weight. Get regular exercise Get regular exercise. This is one of the most important things you can do for your health. Most adults should:  Exercise for at least 150 minutes each week. The exercise should increase your heart rate and make you sweat (moderate-intensity exercise).  Do strengthening exercises at least twice a week. This is in addition to the moderate-intensity exercise.  Spend less time sitting. Even light physical activity can be beneficial. Watch cholesterol and blood lipids Have your blood tested for lipids and cholesterol at 48 years of age, then have this test every 5 years. Have your cholesterol levels checked more often if:  Your lipid or cholesterol levels are high.  You are older than 48 years of age.  You are at high risk for heart disease. What should I know about cancer screening? Depending on your health history and family history, you may need to have cancer screening at various ages. This may include screening for:  Breast cancer.  Cervical cancer.  Colorectal cancer.  Skin cancer.  Lung cancer. What should I know about heart disease, diabetes, and high blood  pressure? Blood pressure and heart disease  High blood pressure causes heart disease and increases the risk of stroke. This is more likely to develop in people who have high blood pressure readings, are of African descent, or are overweight.  Have your blood pressure checked: ? Every 3-5 years if you are 18-39 years of age. ? Every year if you are 40 years old or older. Diabetes Have regular diabetes screenings. This checks your fasting blood sugar level. Have the screening done:  Once every three years after age 40 if you are at a normal weight and have a low risk for diabetes.  More often and at a younger age if you are overweight or have a high risk for diabetes. What should I know about preventing infection? Hepatitis B If you have a higher risk for hepatitis B, you should be screened for this virus. Talk with your health care provider to find out if you are at risk for hepatitis B infection. Hepatitis C Testing is recommended for:  Everyone born from 1945 through 1965.  Anyone with known risk factors for hepatitis C. Sexually transmitted infections (STIs)  Get screened for STIs, including gonorrhea and chlamydia, if: ? You are sexually active and are younger than 48 years of age. ? You are older than 48 years of age and your health care provider tells you that you are at risk for this type of infection. ? Your sexual activity has changed since you were last screened, and you are at increased risk for chlamydia or gonorrhea. Ask your health care provider if   you are at risk.  Ask your health care provider about whether you are at high risk for HIV. Your health care provider may recommend a prescription medicine to help prevent HIV infection. If you choose to take medicine to prevent HIV, you should first get tested for HIV. You should then be tested every 3 months for as long as you are taking the medicine. Pregnancy  If you are about to stop having your period (premenopausal) and  you may become pregnant, seek counseling before you get pregnant.  Take 400 to 800 micrograms (mcg) of folic acid every day if you become pregnant.  Ask for birth control (contraception) if you want to prevent pregnancy. Osteoporosis and menopause Osteoporosis is a disease in which the bones lose minerals and strength with aging. This can result in bone fractures. If you are 65 years old or older, or if you are at risk for osteoporosis and fractures, ask your health care provider if you should:  Be screened for bone loss.  Take a calcium or vitamin D supplement to lower your risk of fractures.  Be given hormone replacement therapy (HRT) to treat symptoms of menopause. Follow these instructions at home: Lifestyle  Do not use any products that contain nicotine or tobacco, such as cigarettes, e-cigarettes, and chewing tobacco. If you need help quitting, ask your health care provider.  Do not use street drugs.  Do not share needles.  Ask your health care provider for help if you need support or information about quitting drugs. Alcohol use  Do not drink alcohol if: ? Your health care provider tells you not to drink. ? You are pregnant, may be pregnant, or are planning to become pregnant.  If you drink alcohol: ? Limit how much you use to 0-1 drink a day. ? Limit intake if you are breastfeeding.  Be aware of how much alcohol is in your drink. In the [place] one drink equals one 12 oz bottle of beer (355 mL), one 5 oz glass of wine (148 mL), or one 1 oz glass of hard liquor (44 mL). General instructions  Schedule regular health, dental, and eye exams.  Stay current with your vaccines.  Tell your health care provider if: ? You often feel depressed. ? You have ever been abused or do not feel safe at home. Summary  Adopting a healthy lifestyle and getting preventive care are important in promoting health and wellness.  Follow your health care provider's instructions about healthy  diet, exercising, and getting tested or screened for diseases.  Follow your health care provider's instructions on monitoring your cholesterol and blood pressure. This information is not intended to replace advice given to you by your health care provider. Make sure you discuss any questions you have with your health care provider. Document Released: 02/13/2011 Document Revised: 07/24/2018 Document Reviewed: 07/24/2018 Elsevier Patient Education  2020 [employer]  

## 2019-03-04 NOTE — Progress Notes (Signed)
Cassandra Hughes [birth year] [id number]    History:    Presents for annual exam.  Amenorrheic on [place] continuously.  Not sexually active denies need for STD screen.  Hypothyroid Dr. Debbora Presto manages.  Normal mammogram history.  Greater than 10 years ago abnormal Pap had a LEEP in [place] (records unavailable) with normal Paps after.  2017 cholecystectomy, mother and maternal grandmother both deceased from gallbladder cancer.  Past medical history, past surgical history, family history and social history were all reviewed and documented in the EPIC chart.  Works from home.  Son is 8 doing well, his father lives in [place] good relationship minimal financial support.  ROS:  A ROS was performed and pertinent positives and negatives are included.  Exam:  Vitals:   03/04/19 0957  BP: 128/80  Weight: 193 lb (87.5 kg)  Height: 5\' 3"  (1.6 m)   Body mass index is 34.19 kg/m.   General appearance:  Normal Thyroid:  Symmetrical, normal in size, without palpable masses or nodularity. Respiratory  Auscultation:  Clear without wheezing or rhonchi Cardiovascular  Auscultation:  Regular rate, without rubs, murmurs or gallops  Edema/varicosities:  Not grossly evident Abdominal  Soft,nontender, without masses, guarding or rebound.  Liver/spleen:  No organomegaly noted  Hernia:  None appreciated  Skin  Inspection:  Grossly normal   Breasts: Examined lying and sitting.     Right: Without masses, retractions, discharge or axillary adenopathy.     Left: Without masses, retractions, discharge or axillary adenopathy. Gentitourinary   Inguinal/mons:  Normal without inguinal adenopathy  External genitalia:  Normal  BUS/Urethra/Skene's glands:  Normal  Vagina: Mild erythema, wet prep negative   Cervix:  Normal  Uterus:  normal in size, shape and contour.  Midline and mobile  Adnexa/parametria:     Rt: Without masses or tenderness.   Lt: Without masses or tenderness.  Anus and  perineum: Normal  Digital rectal exam: Normal sphincter tone without palpated masses or tenderness  Assessment/Plan:  48 y.o. D WF G1, P1 for annual exam with complaint of vaginal irritation.  Amenorrheic on [place] continuously Obesity Hypothyroid-endocrinologist manages Clinical yeast 2008 LEEP [facility]) normal Paps after  Plan: Lessina prescription, proper use, slight risk for blood clots reviewed, denies any menopausal symptoms, reviewed importance of condoms if becomes sexually active.  SBEs, continue annual screening mammogram, calcium rich foods, regular exercise, vitamin D 1000 daily and decrease calorie/carbs encouraged.  Diflucan 150 p.o. x1 dose prescription, proper use given instructed to call if continued problems with vaginal itching and irritation.  CBC, CMP, lipid panel, Pap normal 2019, new screening guidelines reviewed.    Huel Cote [facility], 10:31 AM 03/04/2019

## 2019-03-05 ENCOUNTER — Encounter: Payer: Self-pay | Admitting: Women's Health

## 2019-03-05 LAB — CBC WITH DIFFERENTIAL/PLATELET
Absolute Monocytes: 371 cells/uL (ref 200–950)
Basophils Absolute: 70 cells/uL (ref 0–200)
Basophils Relative: 1.2 %
Eosinophils Absolute: 354 cells/uL (ref 15–500)
Eosinophils Relative: 6.1 %
HCT: 44.5 % (ref 35.0–45.0)
Hemoglobin: 14.8 g/dL (ref 11.7–15.5)
Lymphs Abs: 2274 cells/uL (ref 850–3900)
MCH: 30.4 pg (ref 27.0–33.0)
MCHC: 33.3 g/dL (ref 32.0–36.0)
MCV: 91.4 fL (ref 80.0–100.0)
MPV: 11 fL (ref 7.5–12.5)
Monocytes Relative: 6.4 %
Neutro Abs: 2732 cells/uL (ref 1500–7800)
Neutrophils Relative %: 47.1 %
Platelets: 213 10*3/uL (ref 140–400)
RBC: 4.87 10*6/uL (ref 3.80–5.10)
RDW: 11.8 % (ref 11.0–15.0)
Total Lymphocyte: 39.2 %
WBC: 5.8 10*3/uL (ref 3.8–10.8)

## 2019-03-05 LAB — COMPREHENSIVE METABOLIC PANEL
AG Ratio: 1.7 (calc) (ref 1.0–2.5)
ALT: 23 U/L (ref 6–29)
AST: 23 U/L (ref 10–35)
Albumin: 4.3 g/dL (ref 3.6–5.1)
Alkaline phosphatase (APISO): 68 U/L (ref 31–125)
BUN: 9 mg/dL (ref 7–25)
CO2: 24 mmol/L (ref 20–32)
Calcium: 9.4 mg/dL (ref 8.6–10.2)
Chloride: 106 mmol/L (ref 98–110)
Creat: 0.72 mg/dL (ref 0.50–1.10)
Globulin: 2.6 g/dL (calc) (ref 1.9–3.7)
Glucose, Bld: 85 mg/dL (ref 65–99)
Potassium: 3.8 mmol/L (ref 3.5–5.3)
Sodium: 139 mmol/L (ref 135–146)
Total Bilirubin: 0.6 mg/dL (ref 0.2–1.2)
Total Protein: 6.9 g/dL (ref 6.1–8.1)

## 2019-03-05 LAB — LIPID PANEL
Cholesterol: 200 mg/dL — ABNORMAL HIGH (ref <200)
HDL: 45 mg/dL — ABNORMAL LOW (ref >=50)
LDL Cholesterol (Calc): 125 mg/dL (calc) — ABNORMAL HIGH
Non-HDL Cholesterol (Calc): 155 mg/dL (calc) — ABNORMAL HIGH (ref <130)
Total CHOL/HDL Ratio: 4.4 (calc) (ref <5.0)
Triglycerides: 180 mg/dL — ABNORMAL HIGH (ref <150)

## 2019-03-12 ENCOUNTER — Ambulatory Visit: Payer: BLUE CROSS/BLUE SHIELD

## 2019-08-20 ENCOUNTER — Ambulatory Visit
Admission: RE | Admit: 2019-08-20 | Discharge: 2019-08-20 | Disposition: A | Discharge status: Home / Self Care | Payer: 59 | Source: Ambulatory Visit | Attending: Women's Health | Admitting: Women's Health

## 2019-08-20 ENCOUNTER — Other Ambulatory Visit: Payer: Self-pay

## 2019-08-20 DIAGNOSIS — Z1231 Encounter for screening mammogram for malignant neoplasm of breast: Secondary | ICD-10-CM

## 2020-03-01 ENCOUNTER — Encounter: Payer: BLUE CROSS/BLUE SHIELD | Admitting: Nurse Practitioner

## 2020-04-26 ENCOUNTER — Other Ambulatory Visit: Payer: Self-pay

## 2020-04-26 ENCOUNTER — Ambulatory Visit (INDEPENDENT_AMBULATORY_CARE_PROVIDER_SITE_OTHER): Payer: 59 | Admitting: Nurse Practitioner

## 2020-04-26 ENCOUNTER — Encounter: Payer: Self-pay | Admitting: Nurse Practitioner

## 2020-04-26 VITALS — BP 128/80 | Ht 63.0 in | Wt 192.0 lb

## 2020-04-26 DIAGNOSIS — N951 Menopausal and female climacteric states: Secondary | ICD-10-CM

## 2020-04-26 DIAGNOSIS — Z3041 Encounter for surveillance of contraceptive pills: Secondary | ICD-10-CM | POA: Diagnosis not present

## 2020-04-26 DIAGNOSIS — N9089 Other specified noninflammatory disorders of vulva and perineum: Secondary | ICD-10-CM | POA: Diagnosis not present

## 2020-04-26 DIAGNOSIS — Z01419 Encounter for gynecological examination (general) (routine) without abnormal findings: Secondary | ICD-10-CM | POA: Diagnosis not present

## 2020-04-26 DIAGNOSIS — G43709 Chronic migraine without aura, not intractable, without status migrainosus: Secondary | ICD-10-CM

## 2020-04-26 DIAGNOSIS — Z1321 Encounter for screening for nutritional disorder: Secondary | ICD-10-CM

## 2020-04-26 LAB — WET PREP FOR TRICH, YEAST, CLUE

## 2020-04-26 MED ORDER — HYDROCORTISONE 1 % EX OINT: 1.0000 "application " | TOPICAL_OINTMENT | Freq: Two times a day (BID) | CUTANEOUS | 1 refills | Status: DC

## 2020-04-26 MED ORDER — LEVONORGESTREL-ETHINYL ESTRAD 0.1-20 MG-MCG PO TABS: ORAL_TABLET | ORAL | 4 refills | Status: DC

## 2020-04-26 NOTE — Addendum Note (Signed)
Addended by: Lorine Bears on: 04/26/2020 12:02 PM   Modules accepted: Orders

## 2020-04-26 NOTE — Addendum Note (Signed)
Addended by: Lorine Bears on: 04/26/2020 12:03 PM   Modules accepted: Orders

## 2020-04-26 NOTE — Patient Instructions (Signed)
Menopause Menopause is the normal time of life when menstrual periods stop completely. It is usually confirmed by 12 months without a menstrual period. The transition to menopause (perimenopause) most often happens between the ages of 45 and 55. During perimenopause, hormone levels change in your body, which can cause symptoms and affect your health. Menopause may increase your risk for:  Loss of bone (osteoporosis), which causes bone breaks (fractures).  Depression.  Hardening and narrowing of the arteries (atherosclerosis), which can cause heart attacks and strokes. What are the causes? This condition is usually caused by a natural change in hormone levels that happens as you get older. The condition may also be caused by surgery to remove both ovaries (bilateral oophorectomy). What increases the risk? This condition is more likely to start at an earlier age if you have certain medical conditions or treatments, including:  A tumor of the pituitary gland in the brain.  A disease that affects the ovaries and hormone production.  Radiation treatment for cancer.  Certain cancer treatments, such as chemotherapy or hormone (anti-estrogen) therapy.  Heavy smoking and excessive alcohol use.  Family history of early menopause. This condition is also more likely to develop earlier in women who are very thin. What are the signs or symptoms? Symptoms of this condition include:  Hot flashes.  Irregular menstrual periods.  Night sweats.  Changes in feelings about sex. This could be a decrease in sex drive or an increased comfort around your sexuality.  Vaginal dryness and thinning of the vaginal walls. This may cause painful intercourse.  Dryness of the skin and development of wrinkles.  Headaches.  Problems sleeping (insomnia).  Mood swings or irritability.  Memory problems.  Weight gain.  Hair growth on the face and chest.  Bladder infections or problems with urinating. How  is this diagnosed? This condition is diagnosed based on your medical history, a physical exam, your age, your menstrual history, and your symptoms. Hormone tests may also be done. How is this treated? In some cases, no treatment is needed. You and your health care provider should make a decision together about whether treatment is necessary. Treatment will be based on your individual condition and preferences. Treatment for this condition focuses on managing symptoms. Treatment may include:  Menopausal hormone therapy (MHT).  Medicines to treat specific symptoms or complications.  Acupuncture.  Vitamin or herbal supplements. Before starting treatment, make sure to let your health care provider know if you have a personal or family history of:  Heart disease.  Breast cancer.  Blood clots.  Diabetes.  Osteoporosis. Follow these instructions at home: Lifestyle  Do not use any products that contain nicotine or tobacco, such as cigarettes and e-cigarettes. If you need help quitting, ask your health care provider.  Get at least 30 minutes of physical activity on 5 or more days each week.  Avoid alcoholic and caffeinated beverages, as well as spicy foods. This may help prevent hot flashes.  Get 7-8 hours of sleep each night.  If you have hot flashes, try: ? Dressing in layers. ? Avoiding things that may trigger hot flashes, such as spicy food, warm places, or stress. ? Taking slow, deep breaths when a hot flash starts. ? Keeping a fan in your home and office.  Find ways to manage stress, such as deep breathing, meditation, or journaling.  Consider going to group therapy with other women who are having menopause symptoms. Ask your health care provider about recommended group therapy meetings. Eating and   drinking °· Eat a healthy, balanced diet that contains whole grains, lean protein, low-fat dairy, and plenty of fruits and vegetables. °· Your health care provider may recommend  adding more soy to your diet. Foods that contain soy include tofu, tempeh, and soy milk. °· Eat plenty of foods that contain calcium and vitamin D for bone health. Items that are rich in calcium include low-fat milk, yogurt, beans, almonds, sardines, broccoli, and kale. °Medicines °· Take over-the-counter and prescription medicines only as told by your health care provider. °· Talk with your health care provider before starting any herbal supplements. If prescribed, take vitamins and supplements as told by your health care provider. These may include: °? Calcium. Women age 51 and older should get 1,200 mg (milligrams) of calcium every day. °? Vitamin D. Women need 600-800 International Units of vitamin D each day. °? Vitamins B12 and B6. Aim for 50 micrograms of B12 and 1.5 mg of B6 each day. °General instructions °· Keep track of your menstrual periods, including: °? When they occur. °? How heavy they are and how long they last. °? How much time passes between periods. °· Keep track of your symptoms, noting when they start, how often you have them, and how long they last. °· Use vaginal lubricants or moisturizers to help with vaginal dryness and improve comfort during sex. °· Keep all follow-up visits as told by your health care provider. This is important. This includes any group therapy or counseling. °Contact a health care provider if: °· You are still having menstrual periods after age 55. °· You have pain during sex. °· You have not had a period for 12 months and you develop vaginal bleeding. °Get help right away if: °· You have: °? Severe depression. °? Excessive vaginal bleeding. °? Pain when you urinate. °? A fast or irregular heart beat (palpitations). °? Severe headaches. °? Abdomen (abdominal) pain or severe indigestion. °· You fell and you think you have a broken bone. °· You develop leg or chest pain. °· You develop vision problems. °· You feel a lump in your breast. °Summary °· Menopause is the normal  time of life when menstrual periods stop completely. It is usually confirmed by 12 months without a menstrual period. °· The transition to menopause (perimenopause) most often happens between the ages of 45 and 55. °· Symptoms can be managed through medicines, lifestyle changes, and complementary therapies such as acupuncture. °· Eat a balanced diet that is rich in nutrients to promote bone health and heart health and to manage symptoms during menopause. °This information is not intended to replace advice given to you by your health care provider. Make sure you discuss any questions you have with your health care provider. °Document Revised: 07/13/2017 Document Reviewed: 09/02/2016 °Elsevier Patient Education © 2020 [employer] °Health Maintenance, Female °Adopting a healthy lifestyle and getting preventive care are important in promoting health and wellness. Ask your health care provider about: °· The right schedule for you to have regular tests and exams. °· Things you can do on your own to prevent diseases and keep yourself healthy. °What should I know about diet, weight, and exercise? °Eat a healthy diet ° °· Eat a diet that includes plenty of vegetables, fruits, low-fat dairy products, and lean protein. °· Do not eat a lot of foods that are high in solid fats, added sugars, or sodium. °Maintain a healthy weight °Body mass index (BMI) is used to identify weight problems. It estimates body fat based   on height and weight. Your health care provider can help determine your BMI and help you achieve or maintain a healthy weight. °Get regular exercise °Get regular exercise. This is one of the most important things you can do for your health. Most adults should: °· Exercise for at least 150 minutes each week. The exercise should increase your heart rate and make you sweat (moderate-intensity exercise). °· Do strengthening exercises at least twice a week. This is in addition to the moderate-intensity exercise. °· Spend  less time sitting. Even light physical activity can be beneficial. °Watch cholesterol and blood lipids °Have your blood tested for lipids and cholesterol at 49 years of age, then have this test every 5 years. °Have your cholesterol levels checked more often if: °· Your lipid or cholesterol levels are high. °· You are older than 49 years of age. °· You are at high risk for heart disease. °What should I know about cancer screening? °Depending on your health history and family history, you may need to have cancer screening at various ages. This may include screening for: °· Breast cancer. °· Cervical cancer. °· Colorectal cancer. °· Skin cancer. °· Lung cancer. °What should I know about heart disease, diabetes, and high blood pressure? °Blood pressure and heart disease °· High blood pressure causes heart disease and increases the risk of stroke. This is more likely to develop in people who have high blood pressure readings, are of African descent, or are overweight. °· Have your blood pressure checked: °? Every 3-5 years if you are 18-39 years of age. °? Every year if you are 40 years old or older. °Diabetes °Have regular diabetes screenings. This checks your fasting blood sugar level. Have the screening done: °· Once every three years after age 40 if you are at a normal weight and have a low risk for diabetes. °· More often and at a younger age if you are overweight or have a high risk for diabetes. °What should I know about preventing infection? °Hepatitis B °If you have a higher risk for hepatitis B, you should be screened for this virus. Talk with your health care provider to find out if you are at risk for hepatitis B infection. °Hepatitis C °Testing is recommended for: °· Everyone born from 1945 through 1965. °· Anyone with known risk factors for hepatitis C. °Sexually transmitted infections (STIs) °· Get screened for STIs, including gonorrhea and chlamydia, if: °? You are sexually active and are younger than 49  years of age. °? You are older than 49 years of age and your health care provider tells you that you are at risk for this type of infection. °? Your sexual activity has changed since you were last screened, and you are at increased risk for chlamydia or gonorrhea. Ask your health care provider if you are at risk. °· Ask your health care provider about whether you are at high risk for HIV. Your health care provider may recommend a prescription medicine to help prevent HIV infection. If you choose to take medicine to prevent HIV, you should first get tested for HIV. You should then be tested every 3 months for as long as you are taking the medicine. °Pregnancy °· If you are about to stop having your period (premenopausal) and you may become pregnant, seek counseling before you get pregnant. °· Take 400 to 800 micrograms (mcg) of folic acid every day if you become pregnant. °· Ask for birth control (contraception) if you want to prevent pregnancy. °  Osteoporosis and menopause °Osteoporosis is a disease in which the bones lose minerals and strength with aging. This can result in bone fractures. If you are 65 years old or older, or if you are at risk for osteoporosis and fractures, ask your health care provider if you should: °· Be screened for bone loss. °· Take a calcium or vitamin D supplement to lower your risk of fractures. °· Be given hormone replacement therapy (HRT) to treat symptoms of menopause. °Follow these instructions at home: °Lifestyle °· Do not use any products that contain nicotine or tobacco, such as cigarettes, e-cigarettes, and chewing tobacco. If you need help quitting, ask your health care provider. °· Do not use street drugs. °· Do not share needles. °· Ask your health care provider for help if you need support or information about quitting drugs. °Alcohol use °· Do not drink alcohol if: °? Your health care provider tells you not to drink. °? You are pregnant, may be pregnant, or are planning to  become pregnant. °· If you drink alcohol: °? Limit how much you use to 0-1 drink a day. °? Limit intake if you are breastfeeding. °· Be aware of how much alcohol is in your drink. In the [place] one drink equals one 12 oz bottle of beer (355 mL), one 5 oz glass of wine (148 mL), or one 1½ oz glass of hard liquor (44 mL). °General instructions °· Schedule regular health, dental, and eye exams. °· Stay current with your vaccines. °· Tell your health care provider if: °? You often feel depressed. °? You have ever been abused or do not feel safe at home. °Summary °· Adopting a healthy lifestyle and getting preventive care are important in promoting health and wellness. °· Follow your health care provider's instructions about healthy diet, exercising, and getting tested or screened for diseases. °· Follow your health care provider's instructions on monitoring your cholesterol and blood pressure. °This information is not intended to replace advice given to you by your health care provider. Make sure you discuss any questions you have with your health care provider. °Document Revised: 07/24/2018 Document Reviewed: 07/24/2018 °Elsevier Patient Education © 2020 Elsevier Inc. ° °

## 2020-04-26 NOTE — Progress Notes (Addendum)
   Cassandra Hughes 1971-08-05 [id number]   History:  50 y.o. G1P1 presents for annual exam with complaints of vulvar irritation with intermittent itching, denies discharge or odor. Amenorrheic on continuous OCPs. LEEP 17-20 years ago per patient, subsequent paps normal. Hypothyroidism managed by endocrinology. Being follow by ENT for vertigo, has MRI for this and increased frequency of migraines. Is wondering if migraines could be from birth control. Not currently sexually active.   Gynecologic History No LMP recorded. (Menstrual status: Oral contraceptives).   Contraception: oral progesterone-only contraceptive Last Pap: 11/07/2017. Results were: normal Last mammogram: 08/20/2019. Results were: normal   Past medical history, past surgical history, family history and social history were all reviewed and documented in the EPIC chart.  ROS:  A ROS was performed and pertinent positives and negatives are included.  Exam:  Vitals:   04/26/20 0850  BP: 128/80  Weight: 192 lb (87.1 kg)  Height: 5\' 3"  (1.6 m)   Body mass index is 34.01 kg/m.  General appearance:  Normal Thyroid:  Symmetrical, normal in size, without palpable masses or nodularity. Respiratory  Auscultation:  Clear without wheezing or rhonchi Cardiovascular  Auscultation:  Regular rate, without rubs, murmurs or gallops  Edema/varicosities:  Not grossly evident Abdominal  Soft,nontender, without masses, guarding or rebound.  Liver/spleen:  No organomegaly noted  Hernia:  None appreciated  Skin  Inspection:  Grossly normal   Breasts: Examined lying and sitting.   Right: Without masses, retractions, discharge or axillary adenopathy.   Left: Without masses, retractions, discharge or axillary adenopathy. Gentitourinary   Inguinal/mons:  Normal without inguinal adenopathy  External genitalia:  Normal  BUS/Urethra/Skene's glands:  Normal  Vagina:  Mild redness  Cervix:  Normal  Uterus:  Anteverted, normal in size,  shape and contour.  Midline and mobile  Adnexa/parametria:     Rt: Without masses or tenderness.   Lt: Without masses or tenderness.  Anus and perineum: Normal  Digital rectal exam: Normal sphincter tone without palpated masses or tenderness  Wet prep negative  Assessment/Plan:  49 y.o. G1P1 for annual exam.   Well female exam with routine gynecological exam - Plan: CBC with Differential/Platelet, Comprehensive metabolic panel, Lipid panel. Education provided on SBEs, importance of preventative screenings, current guidelines, high calcium diet, regular exercise, and multivitamin daily.   Menopausal symptoms - Plan: Follicle stimulating hormone. Patient take OCPs continuously. Informed that hormone may fluctuate during perimenopause and is best to check while not on OCPs. She could also stop OCPs for 1 week to see if menses return. She would like [place] checked.  Chronic migraine without Aura- Plan: VITAMIN D 25 Hydroxy (Vit-D Deficiency, Fractures). ENT recommended checking Vitamin D due to increased headaches and vertigo.   Vulvar irritation - Plan: WET PREP FOR TRICH, YEAST, CLUE, hydrocortisone 1 % ointment apply twice a day as needed. Wet prep unremarkable.  Encounter for surveillance of contraceptive pills - Levonorgestrel-ethinyl estradiol (LESSINA-28) 0.1-20 mg-mcg tablet. Takes continuously, refill x 1 year provided.  She will return for lab work at her convenience.   Follow up in 1 year for annual       Keddie, 9:22 AM 04/26/2020

## 2020-04-27 ENCOUNTER — Other Ambulatory Visit: Payer: Self-pay | Admitting: Nurse Practitioner

## 2020-04-27 DIAGNOSIS — E559 Vitamin D deficiency, unspecified: Secondary | ICD-10-CM

## 2020-04-27 LAB — COMPREHENSIVE METABOLIC PANEL
AG Ratio: 1.8 (calc) (ref 1.0–2.5)
ALT: 19 U/L (ref 6–29)
AST: 18 U/L (ref 10–35)
Albumin: 4.2 g/dL (ref 3.6–5.1)
Alkaline phosphatase (APISO): 76 U/L (ref 31–125)
BUN: 7 mg/dL (ref 7–25)
CO2: 24 mmol/L (ref 20–32)
Calcium: 9.2 mg/dL (ref 8.6–10.2)
Chloride: 106 mmol/L (ref 98–110)
Creat: 0.82 mg/dL (ref 0.50–1.10)
Globulin: 2.4 g/dL (calc) (ref 1.9–3.7)
Glucose, Bld: 85 mg/dL (ref 65–99)
Potassium: 4.6 mmol/L (ref 3.5–5.3)
Sodium: 139 mmol/L (ref 135–146)
Total Bilirubin: 0.6 mg/dL (ref 0.2–1.2)
Total Protein: 6.6 g/dL (ref 6.1–8.1)

## 2020-04-27 LAB — CBC WITH DIFFERENTIAL/PLATELET
Absolute Monocytes: 333 cells/uL (ref 200–950)
Basophils Absolute: 62 cells/uL (ref 0–200)
Basophils Relative: 1.2 %
Eosinophils Absolute: 83 cells/uL (ref 15–500)
Eosinophils Relative: 1.6 %
HCT: 43.1 % (ref 35.0–45.0)
Hemoglobin: 14.5 g/dL (ref 11.7–15.5)
Lymphs Abs: 2085 cells/uL (ref 850–3900)
MCH: 31 pg (ref 27.0–33.0)
MCHC: 33.6 g/dL (ref 32.0–36.0)
MCV: 92.1 fL (ref 80.0–100.0)
MPV: 11.1 fL (ref 7.5–12.5)
Monocytes Relative: 6.4 %
Neutro Abs: 2636 cells/uL (ref 1500–7800)
Neutrophils Relative %: 50.7 %
Platelets: 236 10*3/uL (ref 140–400)
RBC: 4.68 10*6/uL (ref 3.80–5.10)
RDW: 12.1 % (ref 11.0–15.0)
Total Lymphocyte: 40.1 %
WBC: 5.2 10*3/uL (ref 3.8–10.8)

## 2020-04-27 LAB — LIPID PANEL
Cholesterol: 212 mg/dL — ABNORMAL HIGH (ref <200)
HDL: 40 mg/dL — ABNORMAL LOW (ref >=50)
LDL Cholesterol (Calc): 141 mg/dL (calc) — ABNORMAL HIGH
Non-HDL Cholesterol (Calc): 172 mg/dL (calc) — ABNORMAL HIGH (ref <130)
Total CHOL/HDL Ratio: 5.3 (calc) — ABNORMAL HIGH (ref <5.0)
Triglycerides: 174 mg/dL — ABNORMAL HIGH (ref <150)

## 2020-04-27 LAB — VITAMIN D 25 HYDROXY (VIT D DEFICIENCY, FRACTURES): Vit D, 25-Hydroxy: 23 ng/mL — ABNORMAL LOW (ref 30–100)

## 2020-04-27 LAB — FOLLICLE STIMULATING HORMONE: FSH: 2.1 m[IU]/mL

## 2020-04-27 MED ORDER — VITAMIN D (ERGOCALCIFEROL) 1.25 MG (50000 UNIT) PO CAPS: 50000.0000 [IU] | ORAL_CAPSULE | ORAL | 0 refills | Status: AC

## 2020-05-10 ENCOUNTER — Other Ambulatory Visit: Payer: 59

## 2020-07-14 ENCOUNTER — Other Ambulatory Visit: Payer: Self-pay | Admitting: Nurse Practitioner

## 2020-07-14 DIAGNOSIS — Z1231 Encounter for screening mammogram for malignant neoplasm of breast: Secondary | ICD-10-CM

## 2020-09-06 ENCOUNTER — Ambulatory Visit
Admission: RE | Admit: 2020-09-06 | Discharge: 2020-09-06 | Disposition: A | Discharge status: Home / Self Care | Payer: 59 | Source: Ambulatory Visit | Attending: Nurse Practitioner | Admitting: Nurse Practitioner

## 2020-09-06 ENCOUNTER — Other Ambulatory Visit: Payer: Self-pay

## 2020-09-06 DIAGNOSIS — Z1231 Encounter for screening mammogram for malignant neoplasm of breast: Secondary | ICD-10-CM

## 2020-12-13 ENCOUNTER — Encounter: Payer: Self-pay | Admitting: Nurse Practitioner

## 2020-12-14 NOTE — Telephone Encounter (Signed)
Just FYI I handled this.

## 2020-12-14 NOTE — Telephone Encounter (Signed)
Thank you Jennifer.

## 2021-02-21 LAB — HM COLONOSCOPY

## 2021-05-09 ENCOUNTER — Encounter: Payer: Self-pay | Admitting: Nurse Practitioner

## 2021-05-09 ENCOUNTER — Ambulatory Visit (INDEPENDENT_AMBULATORY_CARE_PROVIDER_SITE_OTHER): Payer: 59 | Admitting: Nurse Practitioner

## 2021-05-09 ENCOUNTER — Ambulatory Visit: Payer: 59 | Admitting: Nurse Practitioner

## 2021-05-09 ENCOUNTER — Other Ambulatory Visit: Payer: Self-pay

## 2021-05-09 VITALS — BP 120/80 | Ht 63.0 in | Wt 194.0 lb

## 2021-05-09 DIAGNOSIS — Z01419 Encounter for gynecological examination (general) (routine) without abnormal findings: Secondary | ICD-10-CM | POA: Diagnosis not present

## 2021-05-09 DIAGNOSIS — Z3041 Encounter for surveillance of contraceptive pills: Secondary | ICD-10-CM | POA: Diagnosis not present

## 2021-05-09 DIAGNOSIS — Z8639 Personal history of other endocrine, nutritional and metabolic disease: Secondary | ICD-10-CM

## 2021-05-09 MED ORDER — LEVONORGESTREL-ETHINYL ESTRAD 0.1-20 MG-MCG PO TABS: ORAL_TABLET | ORAL | 4 refills | Status: DC

## 2021-05-09 NOTE — Progress Notes (Signed)
   Cassandra Hughes [birth year] [id number]   History:  50 y.o. G1P1 presents for annual exam. Amenorrheic on continuous OCPs. LEEP about 20 years ago per patient, subsequent paps normal. Hypothyroidism managed by endocrinology.   Gynecologic History No LMP recorded. (Menstrual status: Oral contraceptives).   Contraception: abstinence and oral progesterone-only contraceptive Sexually active: No  Health Maintenance Last Pap: 11/07/2017. Results were: Normal Last mammogram: 09/06/2020. Results were: Normal Last colonoscopy: 01/2021. Results were: Normal, 10-year recall Last Dexa: Not indicated  Past medical history, past surgical history, family history and social history were all reviewed and documented in the EPIC chart. Works for [employer]. 50 yo son.  ROS:  A ROS was performed and pertinent positives and negatives are included.  Exam:  Vitals:   05/09/21 1155  BP: 120/80  Weight: 194 lb (88 kg)  Height: 5\' 3"  (1.6 m)    Body mass index is 34.37 kg/m.  General appearance:  Normal Thyroid:  Symmetrical, normal in size, without palpable masses or nodularity. Respiratory  Auscultation:  Clear without wheezing or rhonchi Cardiovascular  Auscultation:  Regular rate, without rubs, murmurs or gallops  Edema/varicosities:  Not grossly evident Abdominal  Soft,nontender, without masses, guarding or rebound.  Liver/spleen:  No organomegaly noted  Hernia:  None appreciated  Skin  Inspection:  Grossly normal   Breasts: Examined lying and sitting.   Right: Without masses, retractions, discharge or axillary adenopathy.   Left: Without masses, retractions, discharge or axillary adenopathy. Gentitourinary   Inguinal/mons:  Normal without inguinal adenopathy  External genitalia:  Normal  BUS/Urethra/Skene's glands:  Normal  Vagina:  Normal without discharge or lesions  Cervix:  Normal  Uterus:  Anteverted, normal in size, shape and contour.  Midline and  mobile  Adnexa/parametria:     Rt: Without masses or tenderness.   Lt: Without masses or tenderness.  Anus and perineum: Normal  Digital rectal exam: Normal sphincter tone without palpated masses or tenderness  Patient informed chaperone available to be present for breast and pelvic exam. Patient has requested no chaperone to be present. Patient has been advised what will be completed during breast and pelvic exam.   Assessment/Plan:  50 y.o. G1P1 for annual exam.   Well female exam with routine gynecological exam - Plan: CBC with Differential/Platelet, Comprehensive metabolic panel. Education provided on SBEs, importance of preventative screenings, current guidelines, high calcium diet, regular exercise, and multivitamin daily.   History of vitamin D deficiency - Plan: VITAMIN D 25 Hydroxy (Vit-D Deficiency, Fractures). Taking 5000 IU daily as recommended by ENT for vertigo.   Hyperlipidemia, unspecified hyperlipidemia type  Encounter for surveillance of contraceptive pills - Plan: levonorgestrel-ethinyl estradiol (LESSINA-28) 0.1-20 MG-MCG tablet daily. Takes continuously. Refill x 1 year provided.   Screening for cervical cancer - Normal Pap history.  Will repeat at 5-year interval per guidelines.  Screening for breast cancer - Normal mammogram history.  Continue annual screenings.  Normal breast exam today.  Screening for colon cancer - 2022 colonoscopy. Will repeat at GI's recommended interval.   Follow up in 1 year for annual       Eldon, 12:20 PM 05/09/2021

## 2021-05-10 ENCOUNTER — Other Ambulatory Visit: Payer: Self-pay | Admitting: Nurse Practitioner

## 2021-05-10 DIAGNOSIS — E559 Vitamin D deficiency, unspecified: Secondary | ICD-10-CM

## 2021-05-10 LAB — CBC WITH DIFFERENTIAL/PLATELET
Absolute Monocytes: 421 cells/uL (ref 200–950)
Basophils Absolute: 92 cells/uL (ref 0–200)
Basophils Relative: 1.5 %
Eosinophils Absolute: 189 cells/uL (ref 15–500)
Eosinophils Relative: 3.1 %
HCT: 44.7 % (ref 35.0–45.0)
Hemoglobin: 14.6 g/dL (ref 11.7–15.5)
Lymphs Abs: 2294 cells/uL (ref 850–3900)
MCH: 29.9 pg (ref 27.0–33.0)
MCHC: 32.7 g/dL (ref 32.0–36.0)
MCV: 91.6 fL (ref 80.0–100.0)
MPV: 11.3 fL (ref 7.5–12.5)
Monocytes Relative: 6.9 %
Neutro Abs: 3105 cells/uL (ref 1500–7800)
Neutrophils Relative %: 50.9 %
Platelets: 196 10*3/uL (ref 140–400)
RBC: 4.88 10*6/uL (ref 3.80–5.10)
RDW: 11.9 % (ref 11.0–15.0)
Total Lymphocyte: 37.6 %
WBC: 6.1 10*3/uL (ref 3.8–10.8)

## 2021-05-10 LAB — COMPREHENSIVE METABOLIC PANEL
AG Ratio: 2 (calc) (ref 1.0–2.5)
ALT: 16 U/L (ref 6–29)
AST: 15 U/L (ref 10–35)
Albumin: 4.3 g/dL (ref 3.6–5.1)
Alkaline phosphatase (APISO): 72 U/L (ref 37–153)
BUN: 10 mg/dL (ref 7–25)
CO2: 23 mmol/L (ref 20–32)
Calcium: 9.1 mg/dL (ref 8.6–10.4)
Chloride: 108 mmol/L (ref 98–110)
Creat: 0.73 mg/dL (ref 0.50–1.03)
Globulin: 2.2 g/dL (calc) (ref 1.9–3.7)
Glucose, Bld: 93 mg/dL (ref 65–99)
Potassium: 4.1 mmol/L (ref 3.5–5.3)
Sodium: 139 mmol/L (ref 135–146)
Total Bilirubin: 0.4 mg/dL (ref 0.2–1.2)
Total Protein: 6.5 g/dL (ref 6.1–8.1)

## 2021-05-10 LAB — VITAMIN D 25 HYDROXY (VIT D DEFICIENCY, FRACTURES): Vit D, 25-Hydroxy: 19 ng/mL — ABNORMAL LOW (ref 30–100)

## 2021-05-10 MED ORDER — VITAMIN D (ERGOCALCIFEROL) 1.25 MG (50000 UNIT) PO CAPS: 50000.0000 [IU] | ORAL_CAPSULE | ORAL | 0 refills | Status: AC

## 2021-08-26 ENCOUNTER — Other Ambulatory Visit: Payer: Self-pay | Admitting: Nurse Practitioner

## 2021-08-26 DIAGNOSIS — Z1231 Encounter for screening mammogram for malignant neoplasm of breast: Secondary | ICD-10-CM

## 2021-09-19 ENCOUNTER — Other Ambulatory Visit: Payer: Self-pay

## 2021-09-19 ENCOUNTER — Ambulatory Visit
Admission: RE | Admit: 2021-09-19 | Discharge: 2021-09-19 | Disposition: A | Discharge status: Home / Self Care | Payer: PRIVATE HEALTH INSURANCE | Source: Ambulatory Visit | Attending: Nurse Practitioner | Admitting: Nurse Practitioner

## 2021-09-19 DIAGNOSIS — Z1231 Encounter for screening mammogram for malignant neoplasm of breast: Secondary | ICD-10-CM

## 2022-05-15 ENCOUNTER — Ambulatory Visit: Payer: 59 | Admitting: Nurse Practitioner

## 2022-06-10 ENCOUNTER — Encounter: Payer: Self-pay | Admitting: Nurse Practitioner

## 2022-06-12 NOTE — Telephone Encounter (Signed)
Cassandra Hughes we don't carry samples do we?

## 2022-06-12 NOTE — Telephone Encounter (Signed)
We are in the process of providing samples, but we are unable to provide at this time. Discuss good Rx with her as an option.

## 2022-06-20 ENCOUNTER — Other Ambulatory Visit: Payer: Self-pay

## 2022-06-20 ENCOUNTER — Other Ambulatory Visit: Payer: Self-pay | Admitting: Nurse Practitioner

## 2022-06-20 DIAGNOSIS — Z3041 Encounter for surveillance of contraceptive pills: Secondary | ICD-10-CM

## 2022-06-20 MED ORDER — LEVONORGESTREL-ETHINYL ESTRAD 0.1-20 MG-MCG PO TABS: ORAL_TABLET | ORAL | 0 refills | Status: DC

## 2022-06-20 NOTE — Telephone Encounter (Signed)
Fax refill request received from [facility] [phone] for Lessina tablets QTY 112.  Patient has AEX scheduled 08/22/22.  RF sent.

## 2022-07-27 IMAGING — MG MM DIGITAL SCREENING BILAT W/ TOMO AND CAD
8 series · 9 of 24 positions shown · non-contrast
Comparison: Previous exam(s).

CLINICAL DATA: Screening.

EXAM:
DIGITAL SCREENING BILATERAL MAMMOGRAM WITH TOMOSYNTHESIS AND CAD
TECHNIQUE: Bilateral screening digital craniocaudal and mediolateral oblique
mammograms were obtained. Bilateral screening digital breast
tomosynthesis was performed. The images were evaluated with
computer-aided detection.

[R MLO synth-2D]
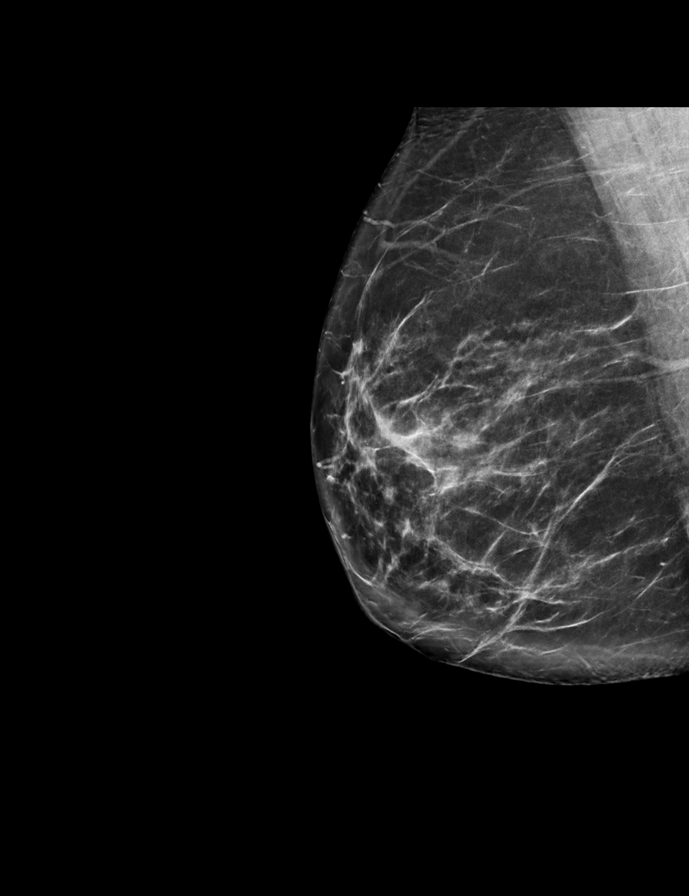

[L MLO synth-2D]
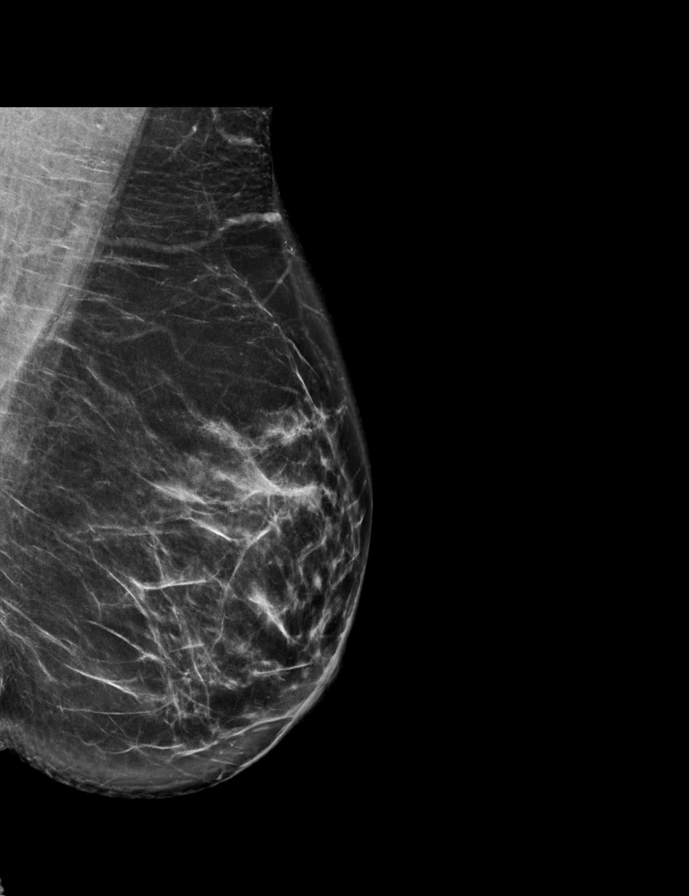

[L CC synth-2D]
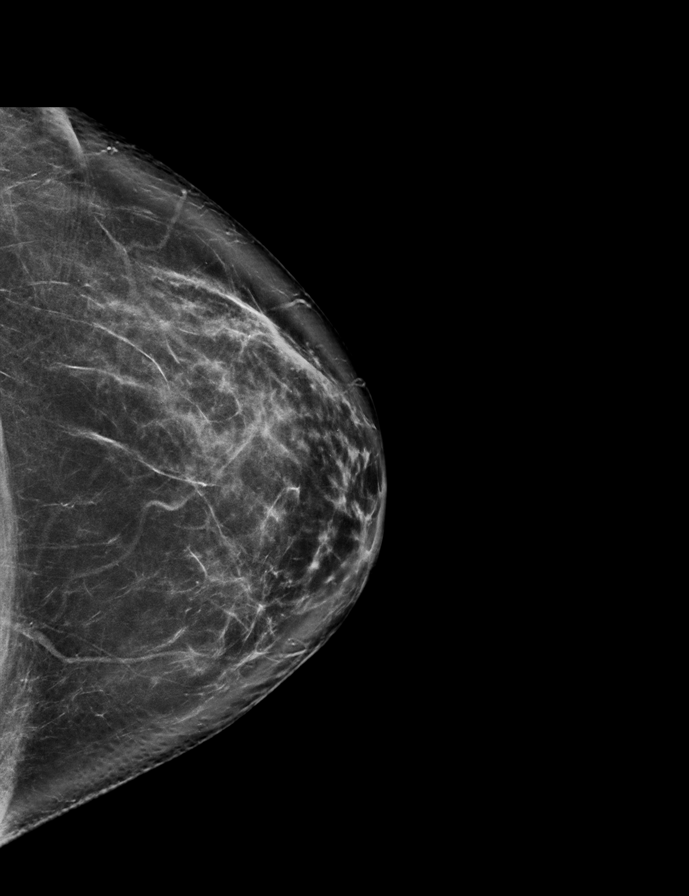

[R CC synth-2D]
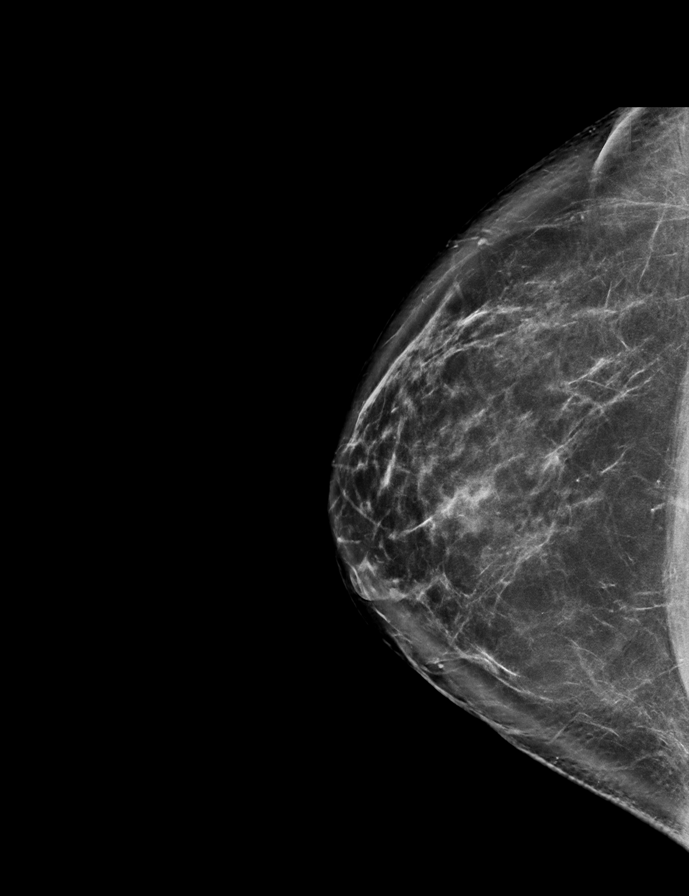

[R MLO tomo · 2 of 80 frames shown]
[frame 26/80]
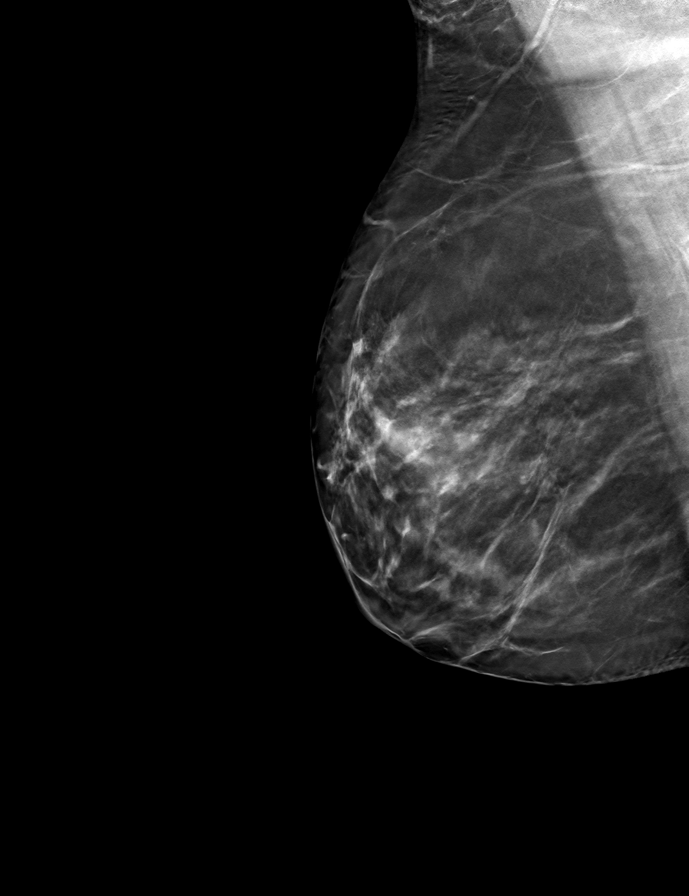
[frame 41/80]
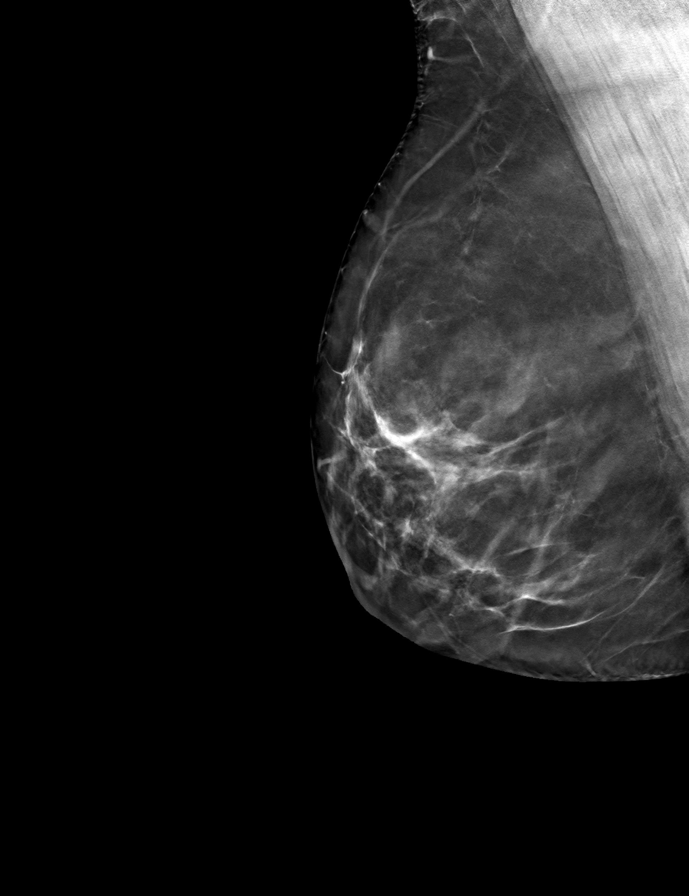

[L MLO tomo · tomo slice 41/81.0]
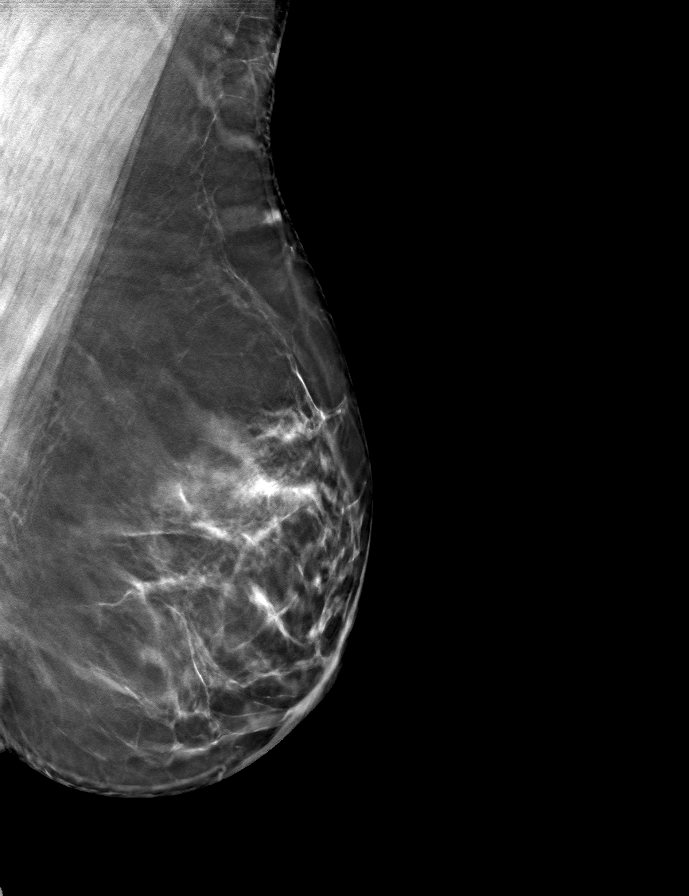

[R CC tomo · tomo slice 44/87.0]
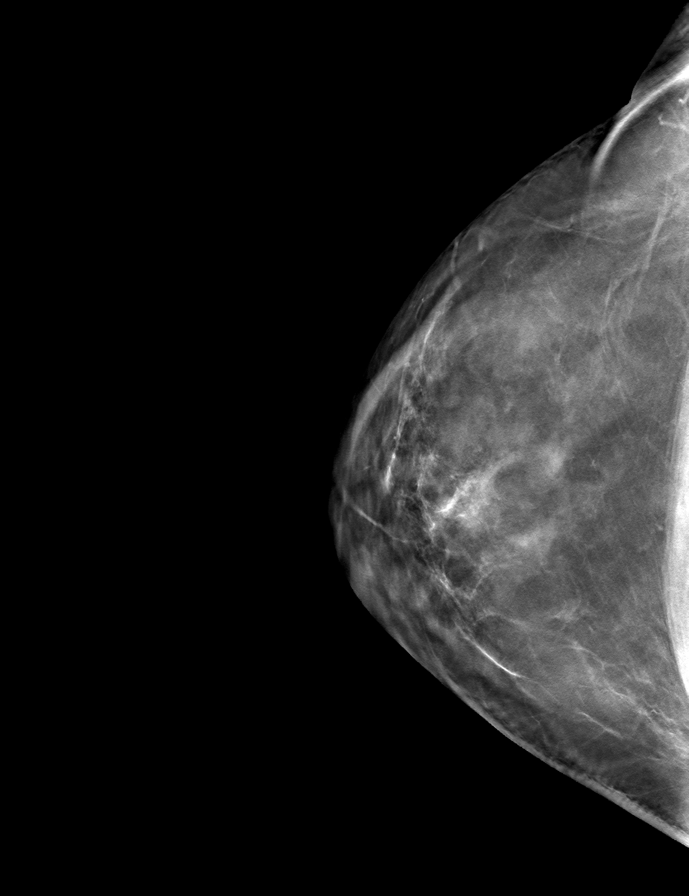

[L CC tomo · tomo slice 43/84.0]
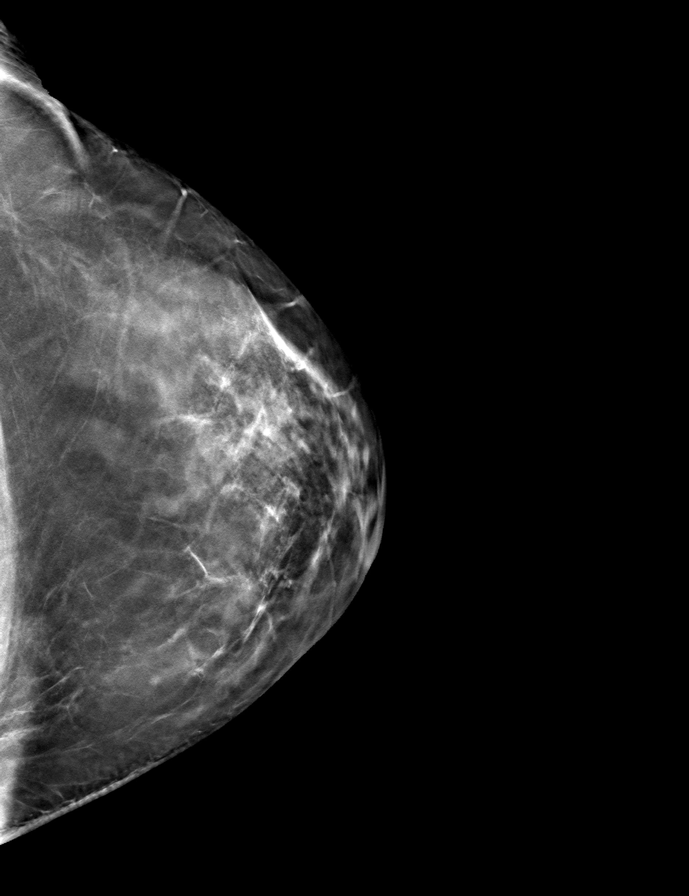

[9 of 24 positions shown; findings below may reference images not displayed]

ACR Breast Density Category b: There are scattered areas of
fibroglandular density.
FINDINGS: There are no findings suspicious for malignancy.
IMPRESSION: No mammographic evidence of malignancy. A result letter of this
screening mammogram will be mailed directly to the patient.

RECOMMENDATION:
Screening mammogram in one year. (Code:51-O-LD2)

BI-RADS CATEGORY  1: Negative.

## 2022-08-10 ENCOUNTER — Encounter: Payer: Self-pay | Admitting: Nurse Practitioner

## 2022-08-22 ENCOUNTER — Encounter: Payer: Self-pay | Admitting: Nurse Practitioner

## 2022-08-22 ENCOUNTER — Ambulatory Visit (INDEPENDENT_AMBULATORY_CARE_PROVIDER_SITE_OTHER): Payer: Medicaid Other | Admitting: Nurse Practitioner

## 2022-08-22 ENCOUNTER — Ambulatory Visit: Payer: 59 | Admitting: Nurse Practitioner

## 2022-08-22 ENCOUNTER — Other Ambulatory Visit (HOSPITAL_COMMUNITY)
Admission: RE | Admit: 2022-08-22 | Discharge: 2022-08-22 | Disposition: A | Discharge status: Home / Self Care | Payer: Medicaid Other | Source: Ambulatory Visit | Attending: Nurse Practitioner | Admitting: Nurse Practitioner

## 2022-08-22 VITALS — BP 118/82 | HR 91 | Ht 63.75 in | Wt 194.0 lb

## 2022-08-22 DIAGNOSIS — N912 Amenorrhea, unspecified: Secondary | ICD-10-CM | POA: Diagnosis not present

## 2022-08-22 DIAGNOSIS — E785 Hyperlipidemia, unspecified: Secondary | ICD-10-CM | POA: Diagnosis not present

## 2022-08-22 DIAGNOSIS — Z1151 Encounter for screening for human papillomavirus (HPV): Secondary | ICD-10-CM | POA: Diagnosis not present

## 2022-08-22 DIAGNOSIS — Z3041 Encounter for surveillance of contraceptive pills: Secondary | ICD-10-CM | POA: Diagnosis not present

## 2022-08-22 DIAGNOSIS — Z793 Long term (current) use of hormonal contraceptives: Secondary | ICD-10-CM | POA: Diagnosis not present

## 2022-08-22 DIAGNOSIS — Z01419 Encounter for gynecological examination (general) (routine) without abnormal findings: Secondary | ICD-10-CM | POA: Diagnosis not present

## 2022-08-22 DIAGNOSIS — E559 Vitamin D deficiency, unspecified: Secondary | ICD-10-CM | POA: Diagnosis not present

## 2022-08-22 MED ORDER — LEVONORGESTREL-ETHINYL ESTRAD 0.1-20 MG-MCG PO TABS: ORAL_TABLET | ORAL | 3 refills | Status: DC

## 2022-08-22 NOTE — Progress Notes (Signed)
   Cassandra Hughes 08/27/1970 [id number]   History:  52 y.o. G1P1 presents for annual exam. Amenorrheic on continuous OCPs. Would like [place] checked. LEEP about 20 years ago per patient, subsequent paps normal. Hypothyroidism managed by endocrinology.   Gynecologic History No LMP recorded. (Menstrual status: Oral contraceptives).   Contraception: abstinence and oral progesterone-only contraceptive Sexually active: No  Health Maintenance Last Pap: 11/07/2017. Results were: Normal neg HPV Last mammogram: 09/19/2021. Results were: Normal Last colonoscopy: 01/2021. Results were: Normal, 10-year recall Last Dexa: Not indicated  Past medical history, past surgical history, family history and social history were all reviewed and documented in the EPIC chart. Nanny for 52 year old, PT at [facility]. 69 yo son, 6th grade at [employer].   ROS:  A ROS was performed and pertinent positives and negatives are included.  Exam:  Vitals:   08/22/22 1336  BP: 118/82  Pulse: 91  SpO2: 98%  Weight: 194 lb (88 kg)  Height: 5' 3.75" (1.619 m)     Body mass index is 33.56 kg/m.  General appearance:  Normal Thyroid:  Symmetrical, normal in size, without palpable masses or nodularity. Respiratory  Auscultation:  Clear without wheezing or rhonchi Cardiovascular  Auscultation:  Regular rate, without rubs, murmurs or gallops  Edema/varicosities:  Not grossly evident Abdominal  Soft,nontender, without masses, guarding or rebound.  Liver/spleen:  No organomegaly noted  Hernia:  None appreciated  Skin  Inspection:  Grossly normal Breasts: Examined lying and sitting.   Right: Without masses, retractions, nipple discharge or axillary adenopathy.   Left: Without masses, retractions, nipple discharge or axillary adenopathy. Genitourinary   Inguinal/mons:  Normal without inguinal adenopathy  External genitalia:  Normal appearing vulva with no masses, tenderness, or lesions  BUS/Urethra/Skene's  glands:  Normal  Vagina:  Normal appearing with normal color and discharge, no lesions  Cervix:  Normal appearing without discharge or lesions  Uterus:  Normal in size, shape and contour.  Midline and mobile, nontender  Adnexa/parametria:     Rt: Normal in size, without masses or tenderness.   Lt: Normal in size, without masses or tenderness.  Anus and perineum: Normal  Patient informed chaperone available to be present for breast and pelvic exam. Patient has requested no chaperone to be present. Patient has been advised what will be completed during breast and pelvic exam.    Assessment/Plan:  52 y.o. G1P1 for annual exam.   Well female exam with routine gynecological exam - Plan: Cytology - PAP( [place]), CBC with Differential/Platelet, Comprehensive metabolic panel. Education provided on SBEs, importance of preventative screenings, current guidelines, high calcium diet, regular exercise, and multivitamin daily. Will return for fasting labs.   Hyperlipidemia, unspecified hyperlipidemia type - Plan: Lipid panel  Amenorrhea due to oral contraceptive - Plan: Follicle stimulating hormone   Vitamin D deficiency - Plan: VITAMIN D 25 Hydroxy (Vit-D Deficiency, Fractures). Taking 5000 IU daily as recommended by ENT for vertigo.   Encounter for surveillance of contraceptive pills - Plan: levonorgestrel-ethinyl estradiol (LESSINA-28) 0.1-20 MG-MCG tablet daily. Takes continuously. Refill x 1 year provided.   Screening for cervical cancer - LEEP > 20 years ago, normal paps since.  Pap today.   Screening for breast cancer - Normal mammogram history.  Continue annual screenings.  Normal breast exam today.  Screening for colon cancer - 2022 colonoscopy. Will repeat at GI's recommended interval.   Follow up in 1 year for annual.      Tamela Gammon [facility], 2:23 PM 08/22/2022

## 2022-08-23 ENCOUNTER — Other Ambulatory Visit: Payer: Self-pay | Admitting: Nurse Practitioner

## 2022-08-23 DIAGNOSIS — Z1231 Encounter for screening mammogram for malignant neoplasm of breast: Secondary | ICD-10-CM

## 2022-08-23 LAB — CYTOLOGY - PAP
Adequacy: ABSENT
Comment: NEGATIVE
Diagnosis: NEGATIVE
High risk HPV: NEGATIVE

## 2022-08-28 ENCOUNTER — Other Ambulatory Visit: Payer: Medicaid Other

## 2022-08-28 DIAGNOSIS — E785 Hyperlipidemia, unspecified: Secondary | ICD-10-CM | POA: Diagnosis not present

## 2022-08-28 DIAGNOSIS — Z793 Long term (current) use of hormonal contraceptives: Secondary | ICD-10-CM | POA: Diagnosis not present

## 2022-08-28 DIAGNOSIS — N912 Amenorrhea, unspecified: Secondary | ICD-10-CM

## 2022-08-28 DIAGNOSIS — Z01419 Encounter for gynecological examination (general) (routine) without abnormal findings: Secondary | ICD-10-CM

## 2022-08-28 DIAGNOSIS — E559 Vitamin D deficiency, unspecified: Secondary | ICD-10-CM

## 2022-08-29 ENCOUNTER — Other Ambulatory Visit: Payer: Self-pay | Admitting: Nurse Practitioner

## 2022-08-29 DIAGNOSIS — E559 Vitamin D deficiency, unspecified: Secondary | ICD-10-CM

## 2022-08-29 LAB — VITAMIN D 25 HYDROXY (VIT D DEFICIENCY, FRACTURES): Vit D, 25-Hydroxy: 16 ng/mL — ABNORMAL LOW (ref 30–100)

## 2022-08-29 LAB — COMPREHENSIVE METABOLIC PANEL
AG Ratio: 1.7 (calc) (ref 1.0–2.5)
ALT: 15 U/L (ref 6–29)
AST: 17 U/L (ref 10–35)
Albumin: 4.3 g/dL (ref 3.6–5.1)
Alkaline phosphatase (APISO): 69 U/L (ref 37–153)
BUN: 12 mg/dL (ref 7–25)
CO2: 26 mmol/L (ref 20–32)
Calcium: 9.3 mg/dL (ref 8.6–10.4)
Chloride: 106 mmol/L (ref 98–110)
Creat: 0.76 mg/dL (ref 0.50–1.03)
Globulin: 2.5 g/dL (calc) (ref 1.9–3.7)
Glucose, Bld: 90 mg/dL (ref 65–99)
Potassium: 4.2 mmol/L (ref 3.5–5.3)
Sodium: 138 mmol/L (ref 135–146)
Total Bilirubin: 0.5 mg/dL (ref 0.2–1.2)
Total Protein: 6.8 g/dL (ref 6.1–8.1)

## 2022-08-29 LAB — CBC WITH DIFFERENTIAL/PLATELET
Absolute Monocytes: 319 cells/uL (ref 200–950)
Basophils Absolute: 50 cells/uL (ref 0–200)
Basophils Relative: 0.9 %
Eosinophils Absolute: 149 cells/uL (ref 15–500)
Eosinophils Relative: 2.7 %
HCT: 43 % (ref 35.0–45.0)
Hemoglobin: 14.4 g/dL (ref 11.7–15.5)
Lymphs Abs: 2145 cells/uL (ref 850–3900)
MCH: 30.6 pg (ref 27.0–33.0)
MCHC: 33.5 g/dL (ref 32.0–36.0)
MCV: 91.3 fL (ref 80.0–100.0)
MPV: 10.9 fL (ref 7.5–12.5)
Monocytes Relative: 5.8 %
Neutro Abs: 2838 cells/uL (ref 1500–7800)
Neutrophils Relative %: 51.6 %
Platelets: 222 10*3/uL (ref 140–400)
RBC: 4.71 10*6/uL (ref 3.80–5.10)
RDW: 11.7 % (ref 11.0–15.0)
Total Lymphocyte: 39 %
WBC: 5.5 10*3/uL (ref 3.8–10.8)

## 2022-08-29 LAB — LIPID PANEL
Cholesterol: 222 mg/dL — ABNORMAL HIGH (ref <200)
HDL: 43 mg/dL — ABNORMAL LOW (ref >=50)
LDL Cholesterol (Calc): 142 mg/dL (calc) — ABNORMAL HIGH
Non-HDL Cholesterol (Calc): 179 mg/dL (calc) — ABNORMAL HIGH (ref <130)
Total CHOL/HDL Ratio: 5.2 (calc) — ABNORMAL HIGH (ref <5.0)
Triglycerides: 231 mg/dL — ABNORMAL HIGH (ref <150)

## 2022-08-29 LAB — FOLLICLE STIMULATING HORMONE: FSH: 2.1 m[IU]/mL

## 2022-08-29 MED ORDER — VITAMIN D (ERGOCALCIFEROL) 1.25 MG (50000 UNIT) PO CAPS: 50000.0000 [IU] | ORAL_CAPSULE | ORAL | 0 refills | Status: AC

## 2022-09-07 DIAGNOSIS — E039 Hypothyroidism, unspecified: Secondary | ICD-10-CM | POA: Diagnosis not present

## 2022-09-14 DIAGNOSIS — E039 Hypothyroidism, unspecified: Secondary | ICD-10-CM | POA: Diagnosis not present

## 2022-10-13 ENCOUNTER — Ambulatory Visit
Admission: RE | Admit: 2022-10-13 | Discharge: 2022-10-13 | Disposition: A | Discharge status: Home / Self Care | Payer: Medicaid Other | Source: Ambulatory Visit | Attending: Nurse Practitioner | Admitting: Nurse Practitioner

## 2022-10-13 DIAGNOSIS — Z1231 Encounter for screening mammogram for malignant neoplasm of breast: Secondary | ICD-10-CM | POA: Diagnosis not present

## 2023-02-12 DIAGNOSIS — H5213 Myopia, bilateral: Secondary | ICD-10-CM | POA: Diagnosis not present

## 2023-03-08 ENCOUNTER — Ambulatory Visit: Payer: Medicaid Other | Admitting: Internal Medicine

## 2023-03-08 ENCOUNTER — Encounter: Payer: Self-pay | Admitting: Internal Medicine

## 2023-03-08 VITALS — BP 126/84 | HR 75 | Temp 98.1°F | Ht 64.0 in | Wt 189.8 lb

## 2023-03-08 DIAGNOSIS — E559 Vitamin D deficiency, unspecified: Secondary | ICD-10-CM

## 2023-03-08 DIAGNOSIS — G43001 Migraine without aura, not intractable, with status migrainosus: Secondary | ICD-10-CM

## 2023-03-08 DIAGNOSIS — R21 Rash and other nonspecific skin eruption: Secondary | ICD-10-CM | POA: Diagnosis not present

## 2023-03-08 DIAGNOSIS — Z23 Encounter for immunization: Secondary | ICD-10-CM

## 2023-03-08 DIAGNOSIS — E785 Hyperlipidemia, unspecified: Secondary | ICD-10-CM | POA: Diagnosis not present

## 2023-03-08 DIAGNOSIS — E039 Hypothyroidism, unspecified: Secondary | ICD-10-CM

## 2023-03-08 DIAGNOSIS — M542 Cervicalgia: Secondary | ICD-10-CM | POA: Diagnosis not present

## 2023-03-08 DIAGNOSIS — Z111 Encounter for screening for respiratory tuberculosis: Secondary | ICD-10-CM

## 2023-03-08 LAB — LIPID PANEL
Cholesterol: 208 mg/dL — ABNORMAL HIGH (ref 0–200)
HDL: 42.4 mg/dL (ref >39.00)
LDL Cholesterol: 138 mg/dL — ABNORMAL HIGH (ref 0–99)
NonHDL: 165.7
Total CHOL/HDL Ratio: 5
Triglycerides: 139 mg/dL (ref 0.0–149.0)
VLDL: 27.8 mg/dL (ref 0.0–40.0)

## 2023-03-08 LAB — COMPREHENSIVE METABOLIC PANEL
ALT: 14 U/L (ref 0–35)
AST: 16 U/L (ref 0–37)
Albumin: 4.2 g/dL (ref 3.5–5.2)
Alkaline Phosphatase: 70 U/L (ref 39–117)
BUN: 9 mg/dL (ref 6–23)
CO2: 26 mEq/L (ref 19–32)
Calcium: 8.9 mg/dL (ref 8.4–10.5)
Chloride: 106 mEq/L (ref 96–112)
Creatinine, Ser: 0.74 mg/dL (ref 0.40–1.20)
GFR: 93.18 mL/min (ref >60.00)
Glucose, Bld: 96 mg/dL (ref 70–99)
Potassium: 3.9 mEq/L (ref 3.5–5.1)
Sodium: 138 mEq/L (ref 135–145)
Total Bilirubin: 0.6 mg/dL (ref 0.2–1.2)
Total Protein: 6.3 g/dL (ref 6.0–8.3)

## 2023-03-08 LAB — VITAMIN D 25 HYDROXY (VIT D DEFICIENCY, FRACTURES): VITD: 18.13 ng/mL — ABNORMAL LOW (ref 30.00–100.00)

## 2023-03-08 LAB — TSH: TSH: 3.98 u[IU]/mL (ref 0.35–5.50)

## 2023-03-08 MED ORDER — TRIAMCINOLONE ACETONIDE 0.1 % EX CREA: TOPICAL_CREAM | CUTANEOUS | 0 refills | Status: DC

## 2023-03-08 MED ORDER — SUMATRIPTAN SUCCINATE 50 MG PO TABS: 50.0000 mg | ORAL_TABLET | ORAL | 2 refills | Status: DC | PRN

## 2023-03-08 NOTE — Progress Notes (Signed)
[facility] [address] [place] [address] Dept: [phone] Dept Fax: [phone]  New Patient Office Visit  Subjective:   Cassandra Hughes [birth year] 03/08/2023  Chief Complaint  Patient presents with   Migraine   Neck Pain    Referral to ortho    Rash   Establish Care    HPI: [name] presents today to establish care at [facility] at [facility]. Introduced to Publishing rights manager role and practice setting.  All questions answered.  Concerns: See below   Discussed the use of AI scribe software for clinical note transcription with the patient, who gave verbal consent to proceed.  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 She needs TB screen and Tdap booster.   They report having migraines for the majority of their life, which they usually manage with Excedrin. However, over the past six months to a year, they have noticed that Excedrin is not always effective, and the migraines can last up to three days. The patient experiences one to two migraines per month.  Additionally, the patient has noticed a rash on their left cheek for the past month. The rash, characterized by small red bumps, was initially thought to be prickles but has not resolved. It becomes itchy when the patient gets hot. The patient has not tried any treatments for this rash.  The patient also reports chronic neck pain, which they believe may be due to arthritis. The pain is located from the base of their skull to the middle of their neck and is worse with changes in weather and neck movement. 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     The following portions of the patient's history were reviewed and updated as appropriate: past medical history, past surgical history, family history, social history, allergies, medications, and problem list.   Patient Active Problem List   Diagnosis Date Noted   Migraine without aura and with status migrainosus, not intractable 03/08/2023   Vitamin D deficiency 03/08/2023   Hyperlipidemia 03/08/2023   Intramural leiomyoma of uterus 02/12/2017   DUB (dysfunctional uterine bleeding) 02/12/2017   History of cholecystectomy 10/17/2016   Family history of cancer of gallbladder 10/15/2015   Past Medical History:  Diagnosis Date   Migraines    Thyroid disease    Past Surgical History:  Procedure Laterality Date   CESAREAN SECTION     CHOLECYSTECTOMY, LAPAROSCOPIC  01/2016   CYST REMOVAL NECK     PELVIC LAPAROSCOPY     fibroid    SKIN CANCER EXCISION N/A    TONSILLECTOMY AND ADENOIDECTOMY     Family History  Problem Relation Age of Onset   Cancer Mother        gallbladder    Cancer Father        prostate   Cancer Maternal Grandmother        Gallbladder cancer    Heart disease Sister    Migraines Sister    Breast cancer Neg Hx    Outpatient Medications Prior to Visit  Medication Sig Dispense Refill   levonorgestrel-ethinyl estradiol (LESSINA-28) 0.1-20 MG-MCG tablet TAKE 1 TABLET BY MOUTH DAILY CONTINUOUSLY. SKIP  PLACEBO 84 tablet 3   thyroid (ARMOUR) 120 MG tablet as directed.     thyroid (ARMOUR) 30 MG tablet as directed.     meloxicam (MOBIC) 15 MG tablet Take 15 mg by mouth daily.     No facility-administered medications prior to visit.   Allergies  Allergen Reactions   Vicodin [Hydrocodone-Acetaminophen] Nausea Only    ROS: A complete ROS was performed with pertinent positives/negatives noted in the HPI. The remainder of the ROS are negative.   Objective:   Today's Vitals   03/08/23 0818  BP: 126/84   Pulse: 75  Temp: 98.1 F (36.7 C)  TempSrc: Temporal  SpO2: 98%  Weight: 189 lb 12.8 oz (86.1 kg)  Height: 5\' 4"  (1.626 m)    GENERAL: Well-appearing, in NAD. Well nourished.  SKIN: Pink, warm and dry. No rash, lesion, ulceration, or ecchymoses.  NECK: Trachea midline. Full ROM. Pain with lateral rotation and extension/flexion. No step offs or palpable deformities. No lymphadenopathy.  RESPIRATORY: Chest wall symmetrical. Respirations even and non-labored. Breath sounds clear to auscultation bilaterally.  CARDIAC: S1, S2 present, regular rate and rhythm. Peripheral pulses 2+ bilaterally.  MSK: Muscle tone and strength appropriate for age.  EXTREMITIES: Without clubbing, cyanosis, or edema.  NEUROLOGIC: No motor or sensory deficits. Steady, even gait.  PSYCH/MENTAL STATUS: Alert, oriented x 3. Cooperative, appropriate mood and affect.   Health Maintenance Due  Topic Date Due   HIV Screening  Never done   Hepatitis C Screening  Never done   Colonoscopy  Never done   Zoster Vaccines- Shingrix (1 of 2) Never done   COVID-19 Vaccine (1 - 2023-24 season) Never done    No results found for any visits on 03/08/23.  Assessment & Plan:  Assessment and Plan 1. Migraine without aura and with status migrainosus, not intractable - SUMAtriptan (IMITREX) 50 MG tablet; Take 1 tablet (50 mg total) by mouth every 2 (two) hours as needed for migraine. May repeat in 2 hours if headache persists or recurs.  Dispense: 10 tablet; Refill: 2 -If frequency of migraines increases, consider preventative therapy.  2. Hyperlipidemia, unspecified hyperlipidemia type - Lipid panel  3. Acquired hypothyroidism - TSH - Comp Met (CMET) - continue NP thyroid 150mg  once daily unless abnormal value on labs, will then adjust based on results  4. Vitamin D deficiency - VITAMIN D 25 Hydroxy (Vit-D Deficiency, Fractures)  5. Rash and nonspecific skin eruption - triamcinolone cream (KENALOG) 0.1 %; Apply to  affected area two times a day for 2 weeks.  Dispense: 45 g; Refill: 0  6. Neck pain - Ambulatory referral to Orthopedic Surgery  7. Screening for tuberculosis - QuantiFERON-TB Gold Plus  8. Need for tetanus booster - Tdap vaccine greater than or equal to 7yo IM    Return in about 3 months (around 06/08/2023) for Fasting Annual Physical Exam.   Of note, portions of this note may have been created with voice recognition software Physicist, medical). While this note has been edited for accuracy, occasional wrong-word or 'sound-a-like' substitutions may have occurred due to the inherent limitations of voice recognition software.  Salvatore Decent, FNP

## 2023-03-09 ENCOUNTER — Other Ambulatory Visit: Payer: Self-pay | Admitting: Internal Medicine

## 2023-03-09 DIAGNOSIS — E559 Vitamin D deficiency, unspecified: Secondary | ICD-10-CM

## 2023-03-09 MED ORDER — VITAMIN D3 1.25 MG (50000 UT) PO CAPS: 1.0000 | ORAL_CAPSULE | ORAL | 0 refills | Status: AC

## 2023-03-12 ENCOUNTER — Other Ambulatory Visit (INDEPENDENT_AMBULATORY_CARE_PROVIDER_SITE_OTHER): Payer: Medicaid Other

## 2023-03-12 ENCOUNTER — Ambulatory Visit: Payer: Medicaid Other | Admitting: Physical Medicine and Rehabilitation

## 2023-03-12 ENCOUNTER — Encounter: Payer: Self-pay | Admitting: Physical Medicine and Rehabilitation

## 2023-03-12 DIAGNOSIS — M7918 Myalgia, other site: Secondary | ICD-10-CM

## 2023-03-12 DIAGNOSIS — G4486 Cervicogenic headache: Secondary | ICD-10-CM

## 2023-03-12 DIAGNOSIS — G43001 Migraine without aura, not intractable, with status migrainosus: Secondary | ICD-10-CM | POA: Diagnosis not present

## 2023-03-12 DIAGNOSIS — M542 Cervicalgia: Secondary | ICD-10-CM | POA: Diagnosis not present

## 2023-03-12 MED ORDER — METHOCARBAMOL 500 MG PO TABS: 500.0000 mg | ORAL_TABLET | Freq: Three times a day (TID) | ORAL | 0 refills | Status: DC

## 2023-03-12 MED ORDER — MELOXICAM 15 MG PO TABS: 15.0000 mg | ORAL_TABLET | Freq: Every day | ORAL | 0 refills | Status: DC

## 2023-03-12 NOTE — Progress Notes (Unsigned)
Cassandra Hughes - 52 y.o. female MRN [id number]  Date of birth: 02/21/1971  Office Visit Note: Visit Date: 03/12/2023 PCP: Salvatore Decent, FNP Referred by: Salvatore Decent, FNP  Subjective: Chief Complaint  Patient presents with   Neck - Pain   HPI: Cassandra Hughes is a 52 y.o. female who comes in today per the request of Salvatore Decent, NP for evaluation of chronic, worsening and severe bilateral neck pain, ongoing for several years. Pain radiates from occipital region down to shoulders and upper back. Her pain seems to be most severe in the morning. No specific aggravating factors. Her neck pain is intermittently associated with headache. 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 Some relief of pain with home exercise regimen, rest and use of medications. No history of formal physical therapy. No prior imaging of cervical spine. Patient denies focal weakness. No recent trauma or falls.   Of note, patient has history of chronic migraine. She recently started Imitrex, prescribed by her PCP.    Review of Systems  Musculoskeletal:  Positive for myalgias and neck pain.  Neurological:  Negative for tingling, sensory change, focal weakness and weakness.  All other systems reviewed and are negative.  Otherwise per HPI.  Assessment & Plan: Visit Diagnoses:    ICD-10-CM   1. Cervicalgia  M54.2 XR Cervical Spine 2 or 3 views    Ambulatory referral to Physical Therapy    2. Myofascial pain syndrome  M79.18 XR Cervical Spine 2 or 3 views    Ambulatory referral to Physical Therapy    3. Cervicogenic headache  G44.86 XR Cervical Spine 2 or 3 views    Ambulatory referral to Physical Therapy    4. Migraine without aura and with status migrainosus, not intractable  G43.001 XR Cervical Spine 2 or 3 views    Ambulatory referral to Physical Therapy       Plan: Findings:  Chronic, worsening and severe bilateral neck pain, pain  radiates from occipital region down to neck, shoulder and upper back. No radicular symptoms down the arms. Patient continues to have severe pain despite good conservative therapies such as home exercise regimen, rest and use of medications. Patients clinical presentation and exam are complex, differentials include myofascial pain syndrome, cervicogenic headache and cervical facet mediated pain. Next step is to place order for short course of formal physical therapy, I do feel she would benefit from manual treatments and dry needling. I discussed medication management with patient and prescribed Meloxicam and Robaxin. I would like to see patient back in 6 weeks for re-evaluation. If her pain persists would consider obtaining lumbar MRI imaging. Patient has no questions at this time. No red flag symptoms noted upon exam today.     Meds & Orders:  Meds ordered this encounter  Medications   methocarbamol (ROBAXIN) 500 MG tablet    Sig: Take 1 tablet (500 mg total) by mouth 3 (three) times daily.    Dispense:  90 tablet    Refill:  0   meloxicam (MOBIC) 15 MG tablet    Sig: Take 1 tablet (15 mg total) by mouth daily.    Dispense:  30 tablet    Refill:  0    Orders Placed This Encounter  Procedures   XR Cervical Spine 2 or 3 views   Ambulatory referral to Physical Therapy    Follow-up: Return for 6 week follow up for re-evaluation.   Procedures: No procedures performed  Clinical History: No specialty comments available.   She reports that she has quit smoking. Her smoking use included cigarettes. She has never used smokeless tobacco. No results for input(s): "HGBA1C", "LABURIC" in the last 8760 hours.  Objective:  VS:  HT:    WT:   BMI:     BP:   HR: bpm  TEMP: ( )  RESP:  Physical Exam Vitals and nursing note reviewed.  HENT:     Head: Normocephalic and atraumatic.     Right Ear: External ear normal.     Left Ear: External ear normal.     Nose: Nose normal.      Mouth/Throat:     Mouth: Mucous membranes are moist.  Eyes:     Extraocular Movements: Extraocular movements intact.  Cardiovascular:     Rate and Rhythm: Normal rate.     Pulses: Normal pulses.  Pulmonary:     Effort: Pulmonary effort is normal.  Abdominal:     General: Abdomen is flat. There is no distension.  Musculoskeletal:        General: Tenderness present.     Cervical back: Tenderness present.     Comments: No discomfort noted with flexion, extension and side-to-side rotation. Patient has good strength in the upper extremities including 5 out of 5 strength in wrist extension, long finger flexion and APB. Shoulder range of motion is full bilaterally without any sign of impingement. There is no atrophy of the hands intrinsically. Sensation intact bilaterally. Tenderness noted to bilateral levator scapulae, trapezius and rhomboid bilaterally. Negative Hoffman's sign. Negative Spurling's sign.     Skin:    General: Skin is warm and dry.     Capillary Refill: Capillary refill takes less than 2 seconds.  Neurological:     General: No focal deficit present.     Mental Status: She is alert and oriented to person, place, and time.  Psychiatric:        Mood and Affect: Mood normal.        Behavior: Behavior normal.     Ortho Exam  Imaging: XR Cervical Spine 2 or 3 views  Result Date: 03/12/2023 AP and lateral radiographs of cervical spine exhibit normal anatomical alignment, no spondylolisthesis. Disc height loss at C5-C6 and C6-C7. No significant degenerative changes noted. No fractures or dislocations.    Past Medical/Family/Surgical/Social History: Medications & Allergies reviewed per EMR, new medications updated. Patient Active Problem List   Diagnosis Date Noted   Migraine without aura and with status migrainosus, not intractable 03/08/2023   Vitamin D deficiency 03/08/2023   Hyperlipidemia 03/08/2023   Acquired hypothyroidism 03/08/2023   Intramural leiomyoma of uterus  02/12/2017   DUB (dysfunctional uterine bleeding) 02/12/2017   Family history of cancer of gallbladder 10/15/2015   Past Medical History:  Diagnosis Date   Migraines    Thyroid disease    Family History  Problem Relation Age of Onset   Cancer Mother        gallbladder    Cancer Father        prostate   Cancer Maternal Grandmother        Gallbladder cancer    Heart disease Sister    Migraines Sister    Breast cancer Neg Hx    Past Surgical History:  Procedure Laterality Date   CESAREAN SECTION     CHOLECYSTECTOMY, LAPAROSCOPIC  01/2016   CYST REMOVAL NECK     PELVIC LAPAROSCOPY     fibroid    SKIN CANCER  EXCISION N/A    TONSILLECTOMY AND ADENOIDECTOMY     Social History   Occupational History   Not on file  Tobacco Use   Smoking status: Former    Types: Cigarettes   Smokeless tobacco: Never  Vaping Use   Vaping status: Never Used  Substance and Sexual Activity   Alcohol use: Yes    Comment: rarely   Drug use: No   Sexual activity: Not Currently    Birth control/protection: OCP    Comment: First IC >16 y/o, [employer], Hx of +TV

## 2023-03-12 NOTE — Progress Notes (Unsigned)
Functional Pain Scale - descriptive words and definitions  Uncomfortable (3)  Pain is present but can complete all ADL's/sleep is slightly affected and passive distraction only gives marginal relief. Mild range order  Average Pain 3-4 but can increase to an 8  Neck pain on both sides that can radiate into the head and into the shoulders

## 2023-03-14 ENCOUNTER — Telehealth: Payer: Self-pay | Admitting: Internal Medicine

## 2023-03-14 ENCOUNTER — Ambulatory Visit: Payer: Medicaid Other | Admitting: Physical Medicine and Rehabilitation

## 2023-03-14 DIAGNOSIS — Z111 Encounter for screening for respiratory tuberculosis: Secondary | ICD-10-CM

## 2023-03-14 NOTE — Telephone Encounter (Signed)
Pt is needing a cb concerning her most recent lab result. Tb blood draw. Please advise pt at [phone]

## 2023-03-15 ENCOUNTER — Telehealth: Payer: Self-pay | Admitting: Internal Medicine

## 2023-03-15 NOTE — Telephone Encounter (Signed)
Left patient a voicemail informing her that results from QuantiFERON-TB Gold Plus Did not results and asked if she could return to have labs redrawn.

## 2023-03-15 NOTE — Telephone Encounter (Signed)
Pt called and said she is returning your call about her labs

## 2023-03-16 ENCOUNTER — Other Ambulatory Visit: Payer: Medicaid Other

## 2023-03-16 DIAGNOSIS — Z111 Encounter for screening for respiratory tuberculosis: Secondary | ICD-10-CM | POA: Diagnosis not present

## 2023-03-16 NOTE — Addendum Note (Signed)
Addended by: Mary Sella D on: 03/16/2023 02:05 PM   Modules accepted: Orders

## 2023-03-16 NOTE — Telephone Encounter (Signed)
Spoke with patient and patient agrees to return to lab to have QuantiFERON-TB Gold Plus lab redrawn

## 2023-03-16 NOTE — Telephone Encounter (Signed)
Pt returned your call about the lab results.

## 2023-03-19 ENCOUNTER — Telehealth: Payer: Self-pay | Admitting: Internal Medicine

## 2023-03-19 NOTE — Telephone Encounter (Signed)
[facility] Paulette martin called and would like for you to call her at [phone] and the ref# is [id number] b )

## 2023-03-20 NOTE — Telephone Encounter (Signed)
Issue resolved with QuantiFERON-TB Gold Plus lab

## 2023-03-28 ENCOUNTER — Other Ambulatory Visit: Payer: Self-pay

## 2023-03-28 ENCOUNTER — Ambulatory Visit: Payer: Medicaid Other | Attending: Physical Medicine and Rehabilitation

## 2023-03-28 DIAGNOSIS — G43001 Migraine without aura, not intractable, with status migrainosus: Secondary | ICD-10-CM | POA: Diagnosis not present

## 2023-03-28 DIAGNOSIS — M7918 Myalgia, other site: Secondary | ICD-10-CM | POA: Insufficient documentation

## 2023-03-28 DIAGNOSIS — M5412 Radiculopathy, cervical region: Secondary | ICD-10-CM | POA: Insufficient documentation

## 2023-03-28 DIAGNOSIS — G4486 Cervicogenic headache: Secondary | ICD-10-CM | POA: Diagnosis not present

## 2023-03-28 DIAGNOSIS — M542 Cervicalgia: Secondary | ICD-10-CM | POA: Diagnosis not present

## 2023-03-28 NOTE — Therapy (Signed)
OUTPATIENT PHYSICAL THERAPY CERVICAL EVALUATION   Patient Name: Cassandra Hughes MRN: [id number] DOB:[birth year], 52 y.o., female Today's Date: 03/31/2023  END OF SESSION:   Past Medical History:  Diagnosis Date   Migraines    Thyroid disease    Past Surgical History:  Procedure Laterality Date   CESAREAN SECTION     CHOLECYSTECTOMY, LAPAROSCOPIC  01/2016   CYST REMOVAL NECK     PELVIC LAPAROSCOPY     fibroid    SKIN CANCER EXCISION N/A    TONSILLECTOMY AND ADENOIDECTOMY     Patient Active Problem List   Diagnosis Date Noted   Migraine without aura and with status migrainosus, not intractable 03/08/2023   Vitamin D deficiency 03/08/2023   Hyperlipidemia 03/08/2023   Acquired hypothyroidism 03/08/2023   Intramural leiomyoma of uterus 02/12/2017   DUB (dysfunctional uterine bleeding) 02/12/2017   Family history of cancer of gallbladder 10/15/2015    PCP: Salvatore Decent, FNP  REFERRING PROVIDER: Juanda Chance, NP  REFERRING DIAG: Cervicalgia [M54.2], Myofascial pain syndrome [M79.18], Cervicogenic headache [G44.86], Migraine without aura and with status migrainosus, not intractable [G43.001]   THERAPY DIAG:  Cervicalgia  Radiculopathy, cervical region  Rationale for Evaluation and Treatment: Rehabilitation  ONSET DATE: 1-2 years   SUBJECTIVE:                                                                                                                                                                                                         SUBJECTIVE STATEMENT:  Pt presents to Pt d/t cervicalgia, and myofascial pain syndrome. She states that her neck pain has been present for years and is worsening lately. She often feels that her pain often moves from the base of her neck to her head or down into her shoulder. She has difficulty with positioning to sleep d/t pain. 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   Hand dominance: Right  PERTINENT HISTORY:  PMHx includes migraines, thyroid disease, vitamin D deficiency, hyperlipidemia   PAIN:  Are you having pain? Yes: NPRS scale: 2-8.5/10 Pain location: neck, BIL shoulders, headaches (usually unilateral from occipital to "temple" Pain description: throbbing, sharp, tightness  Aggravating factors: pillow placement  Relieving factors: meloxicam, ibuprofen   PRECAUTIONS: None  RED FLAGS: None    WEIGHT BEARING RESTRICTIONS: No  FALLS:  Has patient fallen in last 6 months? Yes. 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 Disc height loss at C5-C6 and C6-C7. No significant degenerative changes noted. No fractures or dislocations.   PATIENT SURVEYS:  FOTO 63 current, 18 predicted   COGNITION: Overall cognitive status: Within functional limits for tasks assessed  SENSATION: Not tested  POSTURE: mild forward head and rounded shoulders  PALPATION: Decreased cervical spine mobility with CPAs and UPA's throughout upper to mid cervical spine bilaterally Increased muscle tension bilateral cervical and upper thoracic musculature  CERVICAL ROM:   Active ROM A/PROM (deg) eval  Flexion 60  Extension 45  Right lateral flexion 40  Left lateral flexion 30  Right rotation 58  Left rotation 56   (Blank rows = not  tested)  UPPER EXTREMITY ROM: Grossly within functional limits bilaterally  UPPER EXTREMITY MMT: Grossly within functional limits bilaterally   CERVICAL SPECIAL TESTS:  Not tested   TODAY'S TREATMENT:                                                                                                                               [facility] Adult PT Treatment:                                                DATE: 03/28/23  Initial evaluation: see patient education and home exercise program as noted below    PATIENT EDUCATION:  Education details: reviewed initial home exercise program; discussion of POC, prognosis and goals for skilled PT   Person educated: Patient Education method: Explanation, Demonstration, and Handouts Education comprehension: verbalized understanding, returned demonstration, and needs further education  HOME EXERCISE PROGRAM: Access Code: [email or web address] URL: [email or web address] Date: 03/28/2023 Prepared by: Mauri Reading  Exercises - Supine Cervical Retraction with Towel  - 2 x daily - 7 x weekly - 2 sets - 10 reps - 5 sec hold - Seated Passive Cervical Retraction  - 2 x daily - 7 x weekly - 2 sets - 10 reps - 5 sec hold  ASSESSMENT:  CLINICAL IMPRESSION: Patient is a 52 y.o. female who was seen today for physical therapy evaluation and treatment for neck pain with mobility deficits, migraines and radicular symptoms to bilateral shoulders. She is demonstrating decreased cervical spine mobility, decreased postural endurance, increased muscle tension throughout cervical and thoracic musculature. She has related pain and difficulty with looking over her shoulder, heavy lift doing/carrying, performance of heavy household/occupational duties,  decrease sleep quality. She requires skilled PT services at this time to address relevant deficits and improve overall function.     OBJECTIVE IMPAIRMENTS: decreased activity tolerance, decreased endurance, decreased mobility,  decreased strength, impaired UE functional use, postural dysfunction, and pain.   ACTIVITY LIMITATIONS: carrying, lifting, and sleeping  PARTICIPATION LIMITATIONS: meal prep, cleaning, driving, shopping, community activity, and occupation  PERSONAL FACTORS: Past/current experiences, Profession, Time since onset of injury/illness/exacerbation, and 1-2 comorbidities: PMHx includes migraines, thyroid disease, vitamin D deficiency, hyperlipidemia  are also affecting patient's functional outcome.   REHAB POTENTIAL: Fair    CLINICAL DECISION MAKING: Evolving/moderate complexity  EVALUATION COMPLEXITY: Moderate   GOALS: Goals reviewed with patient? Yes  SHORT TERM GOALS: Target date: 04/26/2023   Patient will be independent with initial home program for cervical mobility.  Baseline: provided at eval  Goal status: INITIAL  2.  Patient will demonstrate improved postural awareness for at least 15 minutes while seated without need for cueing from PT.   Baseline: mild forward head and rounded shoulders Goal status: INITIAL  LONG TERM GOALS: Target date: 05/26/2023   Patient will report improved overall functional ability with FOTO score of 67 or greater.   Baseline: 63 current Goal status: INITIAL  2.  Patient will demonstrate improved CS AROM, including >/=40 degrees left lateral flexion.    Baseline:  Active ROM A/PROM (deg) eval  Flexion 60  Extension 45  Right lateral flexion 40  Left lateral flexion 30  Right rotation 58  Left rotation 56   Goal status: INITIAL   3.  Patient will report less frequent headaches to no more and 2-3 days a week.   Baseline: 4+ days Goal status: INITIAL  4.  Patient will report ability to tolerate full workday without exacerbation of cervical and radiating symptoms.  Baseline: Has not started new job at time of evaluation Goal status: INITIAL   PLAN:  PT FREQUENCY: 1-2x/week  PT DURATION: 8 weeks   PLANNED INTERVENTIONS:  Therapeutic exercises, Therapeutic activity, Neuromuscular re-education, Patient/Family education, Self Care, Joint mobilization, Joint manipulation, Dry Needling, Electrical stimulation, Spinal manipulation, Spinal mobilization, Cryotherapy, Moist heat, Manual therapy, and Re-evaluation  PLAN FOR NEXT SESSION: Consider manual therapy, trigger point dry needling, cervical spine active range of motion, passive mobilization, mobilization with movement, parascapular/postural endurance, modalities as indicated, manual/mechanical traction  Mauri Reading, PT, DPT 03/31/2023, 1:21 PM    Check all possible CPT codes: 16109 - PT Re-evaluation, 97110- Therapeutic Exercise, [phone]- Neuro Re-education, 97140 - Manual Therapy, 97530 - Therapeutic Activities, 97535 - Self Care, [phone] - Mechanical traction, 97014 - Electrical stimulation (unattended), and 97032 - Electrical stimulation (Manual)    Check all conditions that are expected to impact treatment: {Conditions expected to impact treatment:Neurological condition and/or seizures and Social determinants of health   If treatment provided at initial evaluation, no treatment charged due to lack of authorization.

## 2023-04-02 ENCOUNTER — Encounter: Payer: Self-pay | Admitting: Physical Therapy

## 2023-04-02 ENCOUNTER — Ambulatory Visit: Payer: Medicaid Other | Admitting: Physical Therapy

## 2023-04-02 DIAGNOSIS — G4486 Cervicogenic headache: Secondary | ICD-10-CM | POA: Diagnosis not present

## 2023-04-02 DIAGNOSIS — M542 Cervicalgia: Secondary | ICD-10-CM | POA: Diagnosis not present

## 2023-04-02 DIAGNOSIS — G43001 Migraine without aura, not intractable, with status migrainosus: Secondary | ICD-10-CM | POA: Diagnosis not present

## 2023-04-02 DIAGNOSIS — M7918 Myalgia, other site: Secondary | ICD-10-CM | POA: Diagnosis not present

## 2023-04-02 DIAGNOSIS — M5412 Radiculopathy, cervical region: Secondary | ICD-10-CM | POA: Diagnosis not present

## 2023-04-02 NOTE — Therapy (Signed)
OUTPATIENT PHYSICAL THERAPY NOTE   Patient Name: Cassandra Hughes MRN: [id number] DOB:03-13-1971, 52 y.o., female Today's Date: 04/02/2023  END OF SESSION:  PT End of Session - 04/02/23 1529     Visit Number 2    Number of Visits 17    Date for PT Re-Evaluation 05/24/23    Authorization Type [employer]    Authorization Time Period auth after visit 27    PT Start Time 1531    PT Stop Time 1613    PT Time Calculation (min) 42 min    Activity Tolerance Patient tolerated treatment well;No increased pain    Behavior During Therapy [facility] for tasks assessed/performed             Past Medical History:  Diagnosis Date   Migraines    Thyroid disease    Past Surgical History:  Procedure Laterality Date   CESAREAN SECTION     CHOLECYSTECTOMY, LAPAROSCOPIC  01/2016   CYST REMOVAL NECK     PELVIC LAPAROSCOPY     fibroid    SKIN CANCER EXCISION N/A    TONSILLECTOMY AND ADENOIDECTOMY     Patient Active Problem List   Diagnosis Date Noted   Migraine without aura and with status migrainosus, not intractable 03/08/2023   Vitamin D deficiency 03/08/2023   Hyperlipidemia 03/08/2023   Acquired hypothyroidism 03/08/2023   Intramural leiomyoma of uterus 02/12/2017   DUB (dysfunctional uterine bleeding) 02/12/2017   Family history of cancer of gallbladder 10/15/2015    PCP: Salvatore Decent, FNP  REFERRING PROVIDER: Juanda Chance, NP  REFERRING DIAG: Cervicalgia [M54.2], Myofascial pain syndrome [M79.18], Cervicogenic headache [G44.86], Migraine without aura and with status migrainosus, not intractable [G43.001]   THERAPY DIAG:  Cervicalgia  Rationale for Evaluation and Treatment: Rehabilitation  ONSET DATE: 1-2 years   SUBJECTIVE:                                                                                                                                                                                                        Per eval - Pt presents to Pt d/t cervicalgia,  and myofascial pain syndrome. She states that her neck pain has been present for years and is worsening lately. She often feels that her pain often moves from the base of her neck to her head or down into her shoulder. She has difficulty with positioning to sleep d/t pain. 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   Hand dominance:  Right  SUBJECTIVE STATEMENT:  04/02/2023 No pain at present, reports improving tolerance to HEP with good adherence. Also notes meloxicam has been helpful.    PERTINENT HISTORY:  PMHx includes migraines, thyroid disease, vitamin D deficiency, hyperlipidemia   PAIN:  Are you having pain? No pain on arrival 04/02/2023   Per eval: NPRS scale: 2-8.5/10 Pain location: neck, BIL shoulders, headaches (usually unilateral from occipital to "temple" Pain description: throbbing, sharp, tightness  Aggravating factors: pillow placement  Relieving factors: meloxicam, ibuprofen   PRECAUTIONS: None  RED FLAGS: None    WEIGHT BEARING RESTRICTIONS: No  FALLS:  Has patient fallen in last 6 months? Yes. 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 Disc height loss at C5-C6 and C6-C7. No significant degenerative changes  noted. No fractures or dislocations.   PATIENT SURVEYS:  FOTO 63 current, 67 predicted   COGNITION: Overall cognitive status: Within functional limits for tasks assessed  SENSATION: Not tested  POSTURE: mild forward head and rounded shoulders  PALPATION: Decreased cervical spine mobility with CPAs and UPA's throughout upper to mid cervical spine bilaterally Increased muscle tension bilateral cervical and upper thoracic musculature  CERVICAL ROM:   Active ROM A/PROM (deg) eval  Flexion 60  Extension 45  Right lateral flexion 40  Left lateral flexion 30  Right rotation 58  Left rotation 56   (Blank rows = not tested)  UPPER EXTREMITY ROM: Grossly within functional limits bilaterally  UPPER EXTREMITY MMT: Grossly within functional limits bilaterally   CERVICAL SPECIAL TESTS:  Not tested   04/02/23 screen: + median nerve testing on RUE   TODAY'S TREATMENT:                                                                                                                              [facility] Adult PT Treatment:                                                DATE: 04/02/23 Therapeutic Exercise: Median nerve glides RUEx8 within comfortable ROM Seated cervical retraction x10 with 5 sec hold  Supine chin tuck x10 Supine serratus press 2x8 cues for form and setup Supine AAROM w/ dowel, GH flexion x12  Double ER + scapular retraction x8 unweighted, red  band x8 cues for comfortable ROM Red band row 2x8 cues for scap squeeze HEP update + education, provided with red band   PATIENT EDUCATION:  Education details: rationale for interventions, HEP Person educated: Patient Education method: Explanation, Demonstration, and Handouts Education comprehension: verbalized understanding, returned demonstration, and needs further education  HOME EXERCISE PROGRAM: Access Code: [email or web address] URL: [email or web address] Date: 04/02/2023 Prepared by: Fransisco Hertz  Exercises -  Seated Passive Cervical Retraction  - 2 x daily - 7 x weekly - 2 sets - 10 reps - 5 sec hold - Shoulder External Rotation and Scapular Retraction with Resistance  - 2 x daily - 7 x weekly - 1 sets - 8 reps  ASSESSMENT:  CLINICAL IMPRESSION: 04/02/2023 Pt arrives w/o pain, reports improving tolerance to HEP. + median nerve provocation on RUE, addition of nerve glides with good tolerance. Progression for increased demand with postural endurance exercises and scapulothoracic mobility exercises, cues as above. Tolerates well with report of mild muscular fatigue but no increase in pain. No adverse events. Recommend continuing along current POC in order to address relevant deficits and improve functional tolerance. Pt departs today's session in no acute distress, all voiced questions/concerns addressed appropriately from PT perspective.    Per eval - Patient is a 52 y.o. female who was seen today for physical therapy evaluation and treatment for neck pain with mobility deficits, migraines and radicular symptoms to bilateral shoulders. She is demonstrating decreased cervical spine mobility, decreased postural endurance, increased muscle tension throughout cervical and thoracic musculature. She has related pain and difficulty with looking over her shoulder, heavy lift doing/carrying, performance of heavy household/occupational duties, decrease sleep quality. She requires skilled PT services at this time to address relevant deficits and improve overall function.     OBJECTIVE IMPAIRMENTS: decreased activity tolerance, decreased endurance, decreased mobility, decreased strength, impaired UE functional use, postural dysfunction, and pain.   ACTIVITY LIMITATIONS: carrying, lifting, and sleeping  PARTICIPATION LIMITATIONS: meal prep, cleaning, driving, shopping, community activity, and occupation  PERSONAL FACTORS: Past/current experiences, Profession, Time since onset of injury/illness/exacerbation, and 1-2  comorbidities: PMHx includes migraines, thyroid disease, vitamin D deficiency, hyperlipidemia  are also affecting patient's functional outcome.   REHAB POTENTIAL: Fair    CLINICAL DECISION MAKING: Evolving/moderate complexity  EVALUATION COMPLEXITY: Moderate   GOALS: Goals reviewed with patient? Yes  SHORT TERM GOALS: Target date: 04/26/2023   Patient will be independent with initial home program for cervical mobility.  Baseline: provided at eval  Goal status: INITIAL  2.  Patient will demonstrate improved postural awareness for at least 15 minutes while seated without need for cueing from PT.   Baseline: mild forward head and rounded shoulders Goal status: INITIAL  LONG TERM GOALS: Target date: 05/26/2023   Patient will report improved overall functional ability with FOTO score of 67 or greater.   Baseline: 63 current Goal status: INITIAL  2.  Patient will demonstrate improved CS AROM, including >/=40 degrees left lateral flexion.    Baseline:  Active ROM A/PROM (deg) eval  Flexion 60  Extension 45  Right lateral flexion 40  Left lateral flexion 30  Right rotation 58  Left rotation 56   Goal status: INITIAL   3.  Patient will report less frequent headaches to no more and 2-3 days a week.   Baseline: 4+ days Goal status: INITIAL  4.  Patient will report ability to tolerate full workday without exacerbation of cervical and radiating symptoms.  Baseline: Has not started new job at time of  evaluation Goal status: INITIAL   PLAN:  PT FREQUENCY: 1-2x/week  PT DURATION: 8 weeks   PLANNED INTERVENTIONS: Therapeutic exercises, Therapeutic activity, Neuromuscular re-education, Patient/Family education, Self Care, Joint mobilization, Joint manipulation, Dry Needling, Electrical stimulation, Spinal manipulation, Spinal mobilization, Cryotherapy, Moist heat, Manual therapy, and Re-evaluation  PLAN FOR NEXT SESSION: Consider manual therapy, trigger point dry needling,  cervical spine active range of motion, passive mobilization, mobilization with movement, parascapular/postural endurance, modalities as indicated, manual/mechanical traction. Would likely benefit from increased practice with median nerve glides based on 04/02/23 visit   Ashley Murrain PT, DPT 04/02/2023 4:18 PM     Check all possible CPT codes: 78295 - PT Re-evaluation, 97110- Therapeutic Exercise, [phone]- Neuro Re-education, 97140 - Manual Therapy, 97530 - Therapeutic Activities, 97535 - Self Care, [phone] - Mechanical traction, 97014 - Electrical stimulation (unattended), and 97032 - Electrical stimulation (Manual)    Check all conditions that are expected to impact treatment: {Conditions expected to impact treatment:Neurological condition and/or seizures and Social determinants of health   If treatment provided at initial evaluation, no treatment charged due to lack of authorization.

## 2023-04-10 DIAGNOSIS — L739 Follicular disorder, unspecified: Secondary | ICD-10-CM | POA: Diagnosis not present

## 2023-04-10 DIAGNOSIS — L814 Other melanin hyperpigmentation: Secondary | ICD-10-CM | POA: Diagnosis not present

## 2023-04-10 DIAGNOSIS — L570 Actinic keratosis: Secondary | ICD-10-CM | POA: Diagnosis not present

## 2023-04-10 DIAGNOSIS — D1801 Hemangioma of skin and subcutaneous tissue: Secondary | ICD-10-CM | POA: Diagnosis not present

## 2023-04-18 ENCOUNTER — Ambulatory Visit: Payer: Medicaid Other | Attending: Physical Medicine and Rehabilitation

## 2023-04-18 DIAGNOSIS — M542 Cervicalgia: Secondary | ICD-10-CM | POA: Diagnosis not present

## 2023-04-18 DIAGNOSIS — M5412 Radiculopathy, cervical region: Secondary | ICD-10-CM | POA: Diagnosis not present

## 2023-04-18 NOTE — Therapy (Signed)
OUTPATIENT PHYSICAL THERAPY NOTE   Patient Name: Cassandra Hughes MRN: [id number] DOB:1971-03-05, 52 y.o., female Today's Date: 04/18/2023  END OF SESSION:  PT End of Session - 04/18/23 1523     Visit Number 3    Number of Visits 17    Date for PT Re-Evaluation 05/24/23    Authorization Type [employer]    Authorization Time Period auth after visit 27    PT Start Time 1530    PT Stop Time 1610    PT Time Calculation (min) 40 min    Activity Tolerance Patient tolerated treatment well;No increased pain    Behavior During Therapy [facility] for tasks assessed/performed              Past Medical History:  Diagnosis Date   Migraines    Thyroid disease    Past Surgical History:  Procedure Laterality Date   CESAREAN SECTION     CHOLECYSTECTOMY, LAPAROSCOPIC  01/2016   CYST REMOVAL NECK     PELVIC LAPAROSCOPY     fibroid    SKIN CANCER EXCISION N/A    TONSILLECTOMY AND ADENOIDECTOMY     Patient Active Problem List   Diagnosis Date Noted   Migraine without aura and with status migrainosus, not intractable 03/08/2023   Vitamin D deficiency 03/08/2023   Hyperlipidemia 03/08/2023   Acquired hypothyroidism 03/08/2023   Intramural leiomyoma of uterus 02/12/2017   DUB (dysfunctional uterine bleeding) 02/12/2017   Family history of cancer of gallbladder 10/15/2015    PCP: Salvatore Decent, FNP  REFERRING PROVIDER: Juanda Chance, NP  REFERRING DIAG: Cervicalgia [M54.2], Myofascial pain syndrome [M79.18], Cervicogenic headache [G44.86], Migraine without aura and with status migrainosus, not intractable [G43.001]   THERAPY DIAG:  Cervicalgia  Radiculopathy, cervical region  Rationale for Evaluation and Treatment: Rehabilitation  ONSET DATE: 1-2 years   SUBJECTIVE:                                                                                                                                                                                                        Per eval -  Pt presents to Pt d/t cervicalgia, and myofascial pain syndrome. She states that her neck pain has been present for years and is worsening lately. She often feels that her pain often moves from the base of her neck to her head or down into her shoulder. She has difficulty with positioning to sleep d/t pain. 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   Hand dominance: Right  SUBJECTIVE STATEMENT: 04/18/2023 Patient has started her new job for [employer]. She is also no longer taking the prescribed meloxicam. She states that she has some pulling pain in her neck.    PERTINENT HISTORY:  PMHx includes migraines, thyroid disease, vitamin D deficiency, hyperlipidemia   PAIN:  Are you having pain? 4/10 04/18/2023   Per eval: NPRS scale: 2-8.5/10 Pain location: neck, BIL shoulders, headaches (usually unilateral from occipital to "temple") Pain description: throbbing, sharp, tightness  Aggravating factors: pillow placement  Relieving factors: meloxicam, ibuprofen   PRECAUTIONS: None  RED FLAGS: None    WEIGHT BEARING RESTRICTIONS: No  FALLS:  Has patient fallen in last 6 months? Yes. 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 Disc height loss at C5-C6 and C6-C7. No significant degenerative changes noted. No fractures or dislocations.   PATIENT SURVEYS:  FOTO 63 current, 67 predicted   COGNITION: Overall cognitive status: Within functional limits for tasks assessed  SENSATION: Not tested  POSTURE: mild forward head and rounded shoulders  PALPATION: Decreased cervical spine mobility with CPAs and UPA's throughout upper to mid cervical spine bilaterally Increased muscle tension bilateral cervical and upper thoracic musculature  CERVICAL ROM:   Active ROM A/PROM (deg) eval  Flexion 60  Extension 45  Right lateral flexion 40  Left lateral flexion 30  Right rotation 58  Left rotation 56   (Blank rows = not tested)  UPPER EXTREMITY ROM: Grossly within functional limits bilaterally  UPPER EXTREMITY MMT: Grossly within functional limits bilaterally   CERVICAL SPECIAL TESTS:  Not tested   04/02/23 screen: + median nerve testing on RUE   TODAY'S TREATMENT:    [facility] Adult PT Treatment:                                                DATE: 04/18/23 Therapeutic Exercise: Median nerve glides RUE 2x10 (1 set prayer, 1 set tray) within comfortable ROM Seated cervical retraction x10 with 5 sec hold with ball against wall  Supine chin tuck 3 sec, 2 x10 Supine serratus press 2x8 cues for form and setup Supine AAROM w/ dowel 2#, GH flexion x12  Double ER + scapular retraction, red band 2x10 cues for comfortable ROM Red band row 2x10 cues for scap squeeze  Manual Therapy:  Manual cervical traction TPR to bilateral UT, suboccipitals and levator mm.  [facility] Adult PT Treatment:                                                DATE: 04/02/23 Therapeutic Exercise: Median nerve glides RUEx8 within comfortable ROM Seated cervical retraction x10 with 5 sec hold  Supine chin  tuck x10 Supine serratus press 2x8 cues for form and setup Supine AAROM w/ dowel, GH flexion x12  Double ER + scapular retraction x8 unweighted, red band x8 cues for comfortable ROM Red band row 2x8 cues for scap squeeze HEP update + education, provided with red band   PATIENT EDUCATION:  Education details: rationale for interventions, HEP Person educated: Patient Education method: Explanation, Demonstration, and Handouts Education comprehension: verbalized understanding, returned demonstration, and needs further education  HOME EXERCISE PROGRAM: Access Code: [email or web address] URL: [email or web address] Date: 04/02/2023 Prepared by: Fransisco Hertz  Exercises - Seated Passive Cervical Retraction  - 2 x daily - 7 x weekly - 2 sets - 10 reps - 5 sec hold - Shoulder External Rotation and Scapular Retraction with Resistance  - 2 x daily - 7 x weekly - 1 sets - 8 reps  ASSESSMENT:  CLINICAL IMPRESSION: Chrysti responded well to manual therapy today as well as progression of her exercises today. She reports decreased to 3/10 pain at end of session and demonstrates understanding of updated HEP. Encouraged patient to utilize modalities as indicated at end of her work day.    OBJECTIVE IMPAIRMENTS: decreased activity tolerance, decreased endurance, decreased mobility, decreased strength, impaired UE functional use, postural dysfunction, and pain.   ACTIVITY LIMITATIONS: carrying, lifting, and sleeping  PARTICIPATION LIMITATIONS: meal prep, cleaning, driving, shopping, community activity, and occupation  PERSONAL FACTORS: Past/current experiences, Profession, Time since onset of injury/illness/exacerbation, and 1-2 comorbidities: PMHx includes migraines, thyroid disease, vitamin D deficiency, hyperlipidemia  are also affecting patient's functional outcome.   REHAB POTENTIAL: Fair    CLINICAL DECISION MAKING: Evolving/moderate complexity  EVALUATION COMPLEXITY:  Moderate   GOALS: Goals reviewed with patient? Yes  SHORT TERM GOALS: Target date: 04/26/2023   Patient will be independent with initial home program for cervical mobility.  Baseline: provided at eval  Goal status: INITIAL  2.  Patient will demonstrate improved postural awareness for at least 15 minutes while seated without need for cueing from PT.   Baseline: mild forward head and rounded shoulders Goal status: INITIAL  LONG TERM GOALS: Target date: 05/26/2023   Patient will report improved overall functional ability with FOTO score of 67 or greater.   Baseline: 63 current Goal status: INITIAL  2.  Patient will demonstrate improved CS AROM, including >/=40 degrees left lateral flexion.    Baseline:  Active ROM A/PROM (deg) eval  Flexion 60  Extension 45  Right lateral flexion 40  Left lateral flexion 30  Right rotation 58  Left rotation 56   Goal status: INITIAL   3.  Patient will report less frequent headaches to no more and 2-3 days a week.   Baseline: 4+ days Goal status: INITIAL  4.  Patient will report ability to tolerate full workday without exacerbation of cervical and radiating symptoms.  Baseline: Has not started new job at time of evaluation Goal status: INITIAL   PLAN:  PT FREQUENCY: 1-2x/week  PT DURATION: 8 weeks   PLANNED INTERVENTIONS: Therapeutic exercises, Therapeutic activity, Neuromuscular re-education, Patient/Family education, Self Care, Joint mobilization, Joint  manipulation, Dry Needling, Electrical stimulation, Spinal manipulation, Spinal mobilization, Cryotherapy, Moist heat, Manual therapy, and Re-evaluation  PLAN FOR NEXT SESSION: Consider manual therapy, trigger point dry needling, cervical spine active range of motion, passive mobilization, mobilization with movement, parascapular/postural endurance, modalities as indicated, manual/mechanical traction. Would likely benefit from increased practice with median nerve glides based on  04/02/23 visit    Mauri Reading, PT, DPT  04/18/2023 5:19 PM     Check all possible CPT codes: 16109 - PT Re-evaluation, 97110- Therapeutic Exercise, [phone]- Neuro Re-education, 97140 - Manual Therapy, 97530 - Therapeutic Activities, 97535 - Self Care, [phone] - Mechanical traction, 97014 - Electrical stimulation (unattended), and 97032 - Electrical stimulation (Manual)    Check all conditions that are expected to impact treatment: {Conditions expected to impact treatment:Neurological condition and/or seizures and Social determinants of health   If treatment provided at initial evaluation, no treatment charged due to lack of authorization.

## 2023-04-23 ENCOUNTER — Ambulatory Visit: Payer: Medicaid Other | Admitting: Physical Medicine and Rehabilitation

## 2023-04-24 ENCOUNTER — Ambulatory Visit: Payer: Medicaid Other | Admitting: Physical Medicine and Rehabilitation

## 2023-04-24 ENCOUNTER — Encounter: Payer: Self-pay | Admitting: Physical Medicine and Rehabilitation

## 2023-04-24 DIAGNOSIS — M7918 Myalgia, other site: Secondary | ICD-10-CM | POA: Diagnosis not present

## 2023-04-24 DIAGNOSIS — M542 Cervicalgia: Secondary | ICD-10-CM | POA: Diagnosis not present

## 2023-04-24 MED ORDER — MELOXICAM 15 MG PO TABS: 15.0000 mg | ORAL_TABLET | Freq: Every day | ORAL | 0 refills | Status: DC

## 2023-04-24 NOTE — Progress Notes (Unsigned)
Cassandra Hughes - 52 y.o. female MRN [id number]  Date of birth: 02-01-1971  Office Visit Note: Visit Date: 04/24/2023 PCP: Salvatore Decent, FNP Referred by: Salvatore Decent, FNP  Subjective: Chief Complaint  Patient presents with   Neck - Pain   HPI: Cassandra Hughes is a 52 y.o. female who comes in today for evaluation of chronic, worsening and severe chronic, worsening and severe bilateral neck pain, right greater than left, ongoing for several years. Her pain seems to be most severe in the morning and at bedtime. No specific aggravating factors. 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 Concerned she could have carpel tunnel syndrome. No history of nerve studies to bilateral upper extremities. Some relief of pain with home exercise regimen, rest and use of medications. Recent cervical x-rays show  disc height loss at C5-C6 and C6-C7, no spondylolisthesis, no severe arthritis. She recently started formal physical therapy, some relief of pain with these treatments. Upper back pain has improved with ongoing physical therapy. States Imitrex has helped with chronic headaches. Overall, her neck pain remains about the same, not significantly worse but continues to cause pain and functional issues. States she recently started new job in [employer], concerned this job could be exacerbating her symptoms. Patient denies focal weakness. No recent trauma or falls.      Review of Systems  Musculoskeletal:  Positive for myalgias and neck pain.  Neurological:  Negative for tingling, sensory change, focal weakness and weakness.  All other systems reviewed and are negative.  Otherwise per HPI.  Assessment & Plan: Visit Diagnoses:    ICD-10-CM   1. Cervicalgia  M54.2 MR CERVICAL SPINE WO CONTRAST    2. Myofascial pain syndrome  M79.18        Plan: Findings:  Chronic, worsening and severe bilateral  neck pain, right greater than left. No radicular symptoms down the arms. Numbness to bilateral hands is consistent with carpel tunnel syndrome. Patient continues to have severe pain despite good conservative therapies such as formal physical therapy, home exercise regimen, rest and use of medications. Patients clinical presentation and exam are complex, differentials include cervical radiculopathy, cervical facet mediated pain, and myofascial pain. Upper back pain has improved with PT, however bilateral neck pain remains. We discussed treatment plan in detail today, given her pain has been ongoing for several years and minimal relief of pain with conservative therapies I feel next step is to place order for cervical MRI imaging. Depending on results of cervical MRI imaging we would consider performing cervical injections. Positive Phalen's on the right, would consider performing nerve studies to bilateral upper extremities to rule out carpel tunnel. I did refill Meloxicam today, she can continue with physical therapy as directed. No red flag symptoms noted upon exam today.     Meds & Orders:  Meds ordered this encounter  Medications   meloxicam (MOBIC) 15 MG tablet    Sig: Take 1 tablet (15 mg total) by mouth daily.    Dispense:  30 tablet    Refill:  0    Orders Placed This Encounter  Procedures   MR CERVICAL SPINE WO CONTRAST    Follow-up: Return for Cervical MRI review.   Procedures: No procedures performed      Clinical History: No specialty comments available.   She reports that she has quit smoking. Her smoking use included cigarettes. She has never used smokeless tobacco. No results for input(s): "  HGBA1C", "LABURIC" in the last 8760 hours.  Objective:  VS:  HT:    WT:   BMI:     BP:   HR: bpm  TEMP: ( )  RESP:  Physical Exam Vitals and nursing note reviewed.  HENT:     Head: Normocephalic and atraumatic.     Right Ear: External ear normal.     Left Ear: External ear  normal.     Nose: Nose normal.     Mouth/Throat:     Mouth: Mucous membranes are moist.  Eyes:     Extraocular Movements: Extraocular movements intact.  Cardiovascular:     Rate and Rhythm: Normal rate.     Pulses: Normal pulses.  Pulmonary:     Effort: Pulmonary effort is normal.  Abdominal:     General: Abdomen is flat. There is no distension.  Musculoskeletal:        General: Tenderness present.     Cervical back: Tenderness present.     Comments: Discomfort noted with flexion, extension and side-to-side rotation. Patient has good strength in the upper extremities including 5 out of 5 strength in wrist extension, long finger flexion and APB. Shoulder range of motion is full bilaterally without any sign of impingement. There is no atrophy of the hands intrinsically. Sensation intact bilaterally. Negative Hoffman's sign. Negative Spurling's sign. Positive Phalen's on the right.     Skin:    General: Skin is warm and dry.     Capillary Refill: Capillary refill takes less than 2 seconds.  Neurological:     General: No focal deficit present.     Mental Status: She is alert and oriented to person, place, and time.  Psychiatric:        Mood and Affect: Mood normal.        Behavior: Behavior normal.     Ortho Exam  Imaging: No results found.  Past Medical/Family/Surgical/Social History: Medications & Allergies reviewed per EMR, new medications updated. Patient Active Problem List   Diagnosis Date Noted   Migraine without aura and with status migrainosus, not intractable 03/08/2023   Vitamin D deficiency 03/08/2023   Hyperlipidemia 03/08/2023   Acquired hypothyroidism 03/08/2023   Intramural leiomyoma of uterus 02/12/2017   DUB (dysfunctional uterine bleeding) 02/12/2017   Family history of cancer of gallbladder 10/15/2015   Past Medical History:  Diagnosis Date   Migraines    Thyroid disease    Family History  Problem Relation Age of Onset   Cancer Mother         gallbladder    Cancer Father        prostate   Cancer Maternal Grandmother        Gallbladder cancer    Heart disease Sister    Migraines Sister    Breast cancer Neg Hx    Past Surgical History:  Procedure Laterality Date   CESAREAN SECTION     CHOLECYSTECTOMY, LAPAROSCOPIC  01/2016   CYST REMOVAL NECK     PELVIC LAPAROSCOPY     fibroid    SKIN CANCER EXCISION N/A    TONSILLECTOMY AND ADENOIDECTOMY     Social History   Occupational History   Not on file  Tobacco Use   Smoking status: Former    Types: Cigarettes   Smokeless tobacco: Never  Vaping Use   Vaping status: Never Used  Substance and Sexual Activity   Alcohol use: Yes    Comment: rarely   Drug use: No   Sexual activity: Not  Currently    Birth control/protection: OCP    Comment: First IC >16 y/o, [employer], Hx of +TV

## 2023-04-24 NOTE — Progress Notes (Unsigned)
Functional Pain Scale - descriptive words and definitions  Uncomfortable (3)  Pain is present but can complete all ADL's/sleep is slightly affected and passive distraction only gives marginal relief. Mild range order  Average Pain 3  Neck pain. Feels better but can be bad at night

## 2023-04-25 ENCOUNTER — Ambulatory Visit: Payer: Medicaid Other

## 2023-04-25 DIAGNOSIS — M542 Cervicalgia: Secondary | ICD-10-CM

## 2023-04-25 DIAGNOSIS — M5412 Radiculopathy, cervical region: Secondary | ICD-10-CM | POA: Diagnosis not present

## 2023-04-25 NOTE — Therapy (Signed)
OUTPATIENT PHYSICAL THERAPY NOTE   Patient Name: Cassandra Hughes MRN: [id number] DOB:[birth year], 52 y.o., female Today's Date: 04/25/2023  END OF SESSION:  PT End of Session - 04/25/23 1529     Visit Number 4    Number of Visits 17    Date for PT Re-Evaluation 05/24/23    Authorization Type [employer]    Authorization Time Period auth after visit 27    PT Start Time 1530    PT Stop Time 1610    PT Time Calculation (min) 40 min    Activity Tolerance Patient tolerated treatment well;No increased pain    Behavior During Therapy [facility] for tasks assessed/performed               Past Medical History:  Diagnosis Date   Migraines    Thyroid disease    Past Surgical History:  Procedure Laterality Date   CESAREAN SECTION     CHOLECYSTECTOMY, LAPAROSCOPIC  01/2016   CYST REMOVAL NECK     PELVIC LAPAROSCOPY     fibroid    SKIN CANCER EXCISION N/A    TONSILLECTOMY AND ADENOIDECTOMY     Patient Active Problem List   Diagnosis Date Noted   Migraine without aura and with status migrainosus, not intractable 03/08/2023   Vitamin D deficiency 03/08/2023   Hyperlipidemia 03/08/2023   Acquired hypothyroidism 03/08/2023   Intramural leiomyoma of uterus 02/12/2017   DUB (dysfunctional uterine bleeding) 02/12/2017   Family history of cancer of gallbladder 10/15/2015    PCP: Salvatore Decent, FNP  REFERRING PROVIDER: Juanda Chance, NP  REFERRING DIAG: Cervicalgia [M54.2], Myofascial pain syndrome [M79.18], Cervicogenic headache [G44.86], Migraine without aura and with status migrainosus, not intractable [G43.001]   THERAPY DIAG:  Cervicalgia  Radiculopathy, cervical region  Rationale for Evaluation and Treatment: Rehabilitation  ONSET DATE: 1-2 years   SUBJECTIVE:                                                                                                                                                                                                        Per  eval - Pt presents to Pt d/t cervicalgia, and myofascial pain syndrome. She states that her neck pain has been present for years and is worsening lately. She often feels that her pain often moves from the base of her neck to her head or down into her shoulder. She has difficulty with positioning to sleep d/t pain. 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   Hand dominance: Right  SUBJECTIVE STATEMENT: Patient reports that she continues to have radicular symptoms in bilateral hands.  She does feel relief when she puts her hands over her head.  She continues to be compliant with home exercise program.   PERTINENT HISTORY:  PMHx includes migraines, thyroid disease, vitamin D deficiency, hyperlipidemia   PAIN:  Are you having pain? 4/10    Per eval: NPRS scale: 2-8.5/10 Pain location: neck, BIL shoulders, headaches (usually unilateral from occipital to "temple") Pain description: throbbing, sharp, tightness  Aggravating factors: pillow placement  Relieving factors: meloxicam, ibuprofen   PRECAUTIONS: None  RED FLAGS: None    WEIGHT BEARING RESTRICTIONS: No  FALLS:  Has patient fallen in last 6 months? Yes. 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 Disc height loss at C5-C6 and C6-C7. No significant degenerative changes noted. No fractures or dislocations.   PATIENT SURVEYS:  FOTO 63 current, 67 predicted   COGNITION: Overall cognitive status: Within functional limits for tasks assessed  SENSATION: Not tested  POSTURE: mild forward head and rounded shoulders  PALPATION: Decreased cervical spine mobility with CPAs and UPA's throughout upper to mid cervical spine bilaterally Increased muscle tension bilateral cervical and upper thoracic musculature  CERVICAL ROM:   Active ROM A/PROM (deg) eval  Flexion 60  Extension 45  Right lateral flexion 40  Left lateral flexion 30  Right rotation 58  Left rotation 56   (Blank rows = not tested)  UPPER EXTREMITY ROM: Grossly within functional limits bilaterally  UPPER EXTREMITY MMT: Grossly within functional limits bilaterally   CERVICAL SPECIAL TESTS:  Not tested   04/02/23 screen: + median nerve testing on RUE   TODAY'S TREATMENT:    [facility] Adult PT Treatment:                                                DATE: 04/25/23 Therapeutic Exercise: Median nerve glides RUE 2x10 (1 set prayer, 1 set tray) within comfortable ROM Seated cervical retraction x15 with 5 sec hold with ball against wall  Supine chin tuck 5 sec, 2 x10 Supine serratus press 1 x 10, 1 x 8  Supine serratus press with dowel, 3#, 2 x 5 Supine AAROM GH flexion w/ dowel 2# x15 green band row 2x10 cues for scap squeeze  Manual Therapy:  Manual cervical traction TPR to bilateral UT, suboccipitals and levator mm.   Manual UT/levator stretching BIL   [facility] Adult PT Treatment:                                                DATE: 04/18/23 Therapeutic Exercise: Median nerve glides  RUE 2x10 (1 set prayer, 1 set tray) within comfortable ROM Seated cervical retraction x10 with 5 sec hold with ball against wall  Supine chin tuck 3 sec, 2 x10 Supine serratus press 2x8 cues for form and setup Supine  AAROM w/ dowel 2#, GH flexion x12  Double ER + scapular retraction, red band 2x10 cues for comfortable ROM Red band row 2x10 cues for scap squeeze  Manual Therapy:  Manual cervical traction TPR to bilateral UT, suboccipitals and levator mm.                                                                                                                               [facility] Adult PT Treatment:                                                DATE: 04/02/23 Therapeutic Exercise: Median nerve glides RUEx8 within comfortable ROM Seated cervical retraction x10 with 5 sec hold  Supine chin tuck x10 Supine serratus press 2x8 cues for form and setup Supine AAROM w/ dowel, GH flexion x12  Double ER + scapular retraction x8 unweighted, red band x8 cues for comfortable ROM Red band row 2x8 cues for scap squeeze HEP update + education, provided with red band   PATIENT EDUCATION:  Education details: rationale for interventions, HEP Person educated: Patient Education method: Explanation, Demonstration, and Handouts Education comprehension: verbalized understanding, returned demonstration, and needs further education  HOME EXERCISE PROGRAM: Access Code: [email or web address] URL: [email or web address] Date: 04/02/2023 Prepared by: Fransisco Hertz  Exercises - Seated Passive Cervical Retraction  - 2 x daily - 7 x weekly - 2 sets - 10 reps - 5 sec hold - Shoulder External Rotation and Scapular Retraction with Resistance  - 2 x daily - 7 x weekly - 1 sets - 8 reps  ASSESSMENT:  CLINICAL IMPRESSION: Patient responded well to skilled physical therapy today, and had improved radicular symptoms following manual therapy.  She was able to tolerate some progression of exercises today, and will continue to benefit from ongoing physical therapy intervention to address remaining deficits.    OBJECTIVE IMPAIRMENTS: decreased activity tolerance, decreased endurance, decreased mobility, decreased strength,  impaired UE functional use, postural dysfunction, and pain.   ACTIVITY LIMITATIONS: carrying, lifting, and sleeping  PARTICIPATION LIMITATIONS: meal prep, cleaning, driving, shopping, community activity, and occupation  PERSONAL FACTORS: Past/current experiences, Profession, Time since onset of injury/illness/exacerbation, and 1-2 comorbidities: PMHx includes migraines, thyroid disease, vitamin D deficiency, hyperlipidemia  are also affecting patient's functional outcome.   REHAB POTENTIAL: Fair    CLINICAL DECISION MAKING: Evolving/moderate complexity  EVALUATION COMPLEXITY: Moderate   GOALS: Goals reviewed with patient? Yes  SHORT TERM GOALS: Target date: 04/26/2023   Patient will be independent with initial home program for cervical mobility.  Baseline: provided at eval  Goal status:  INITIAL  2.  Patient will demonstrate improved postural awareness for at least 15 minutes while seated without need for cueing from PT.   Baseline: mild forward head and rounded shoulders Goal status: INITIAL  LONG TERM GOALS: Target date: 05/26/2023   Patient will report improved overall functional ability with FOTO score of 67 or greater.   Baseline: 63 current Goal status: INITIAL  2.  Patient will demonstrate improved CS AROM, including >/=40 degrees left lateral flexion.    Baseline:  Active ROM A/PROM (deg) eval  Flexion 60  Extension 45  Right lateral flexion 40  Left lateral flexion 30  Right rotation 58  Left rotation 56   Goal status: INITIAL   3.  Patient will report less frequent headaches to no more and 2-3 days a week.   Baseline: 4+ days Goal status: INITIAL  4.  Patient will report ability to tolerate full workday without exacerbation of cervical and radiating symptoms.  Baseline: Has not started new job at time of evaluation Goal status: INITIAL   PLAN:  PT FREQUENCY: 1-2x/week  PT DURATION: 8 weeks   PLANNED INTERVENTIONS: Therapeutic exercises,  Therapeutic activity, Neuromuscular re-education, Patient/Family education, Self Care, Joint mobilization, Joint manipulation, Dry Needling, Electrical stimulation, Spinal manipulation, Spinal mobilization, Cryotherapy, Moist heat, Manual therapy, and Re-evaluation  PLAN FOR NEXT SESSION: Consider manual therapy, trigger point dry needling, cervical spine active range of motion, passive mobilization, mobilization with movement, parascapular/postural endurance, modalities as indicated, manual/mechanical traction. Would likely benefit from increased practice with median nerve glides based on 04/02/23 visit    Mauri Reading, PT, DPT  04/25/2023 6:49 PM     Check all possible CPT codes: 40981 - PT Re-evaluation, 97110- Therapeutic Exercise, [phone]- Neuro Re-education, 97140 - Manual Therapy, 97530 - Therapeutic Activities, 97535 - Self Care, [phone] - Mechanical traction, 97014 - Electrical stimulation (unattended), and 97032 - Electrical stimulation (Manual)    Check all conditions that are expected to impact treatment: {Conditions expected to impact treatment:Neurological condition and/or seizures and Social determinants of health   If treatment provided at initial evaluation, no treatment charged due to lack of authorization.

## 2023-05-02 ENCOUNTER — Ambulatory Visit: Payer: Medicaid Other

## 2023-05-09 ENCOUNTER — Encounter: Payer: Self-pay | Admitting: Physical Medicine and Rehabilitation

## 2023-05-09 NOTE — Therapy (Incomplete)
OUTPATIENT PHYSICAL THERAPY NOTE   Patient Name: Cassandra Hughes MRN: [id number] DOB:Aug 24, 1970, 52 y.o., female Today's Date: 05/09/2023  END OF SESSION:      Past Medical History:  Diagnosis Date   Migraines    Thyroid disease    Past Surgical History:  Procedure Laterality Date   CESAREAN SECTION     CHOLECYSTECTOMY, LAPAROSCOPIC  01/2016   CYST REMOVAL NECK     PELVIC LAPAROSCOPY     fibroid    SKIN CANCER EXCISION N/A    TONSILLECTOMY AND ADENOIDECTOMY     Patient Active Problem List   Diagnosis Date Noted   Migraine without aura and with status migrainosus, not intractable 03/08/2023   Vitamin D deficiency 03/08/2023   Hyperlipidemia 03/08/2023   Acquired hypothyroidism 03/08/2023   Intramural leiomyoma of uterus 02/12/2017   DUB (dysfunctional uterine bleeding) 02/12/2017   Family history of cancer of gallbladder 10/15/2015    PCP: Salvatore Decent, FNP  REFERRING PROVIDER: Juanda Chance, NP  REFERRING DIAG: Cervicalgia [M54.2], Myofascial pain syndrome [M79.18], Cervicogenic headache [G44.86], Migraine without aura and with status migrainosus, not intractable [G43.001]   THERAPY DIAG:  No diagnosis found.  Rationale for Evaluation and Treatment: Rehabilitation  ONSET DATE: 1-2 years   SUBJECTIVE:                                                                                                                                                                                                        Per eval - Pt presents to Pt d/t cervicalgia, and myofascial pain syndrome. She states that her neck pain has been present for years and is worsening lately. She often feels that her pain often moves from the base of her neck to her head or down into her shoulder. She has difficulty with positioning to sleep d/t pain. 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    Hand dominance: Right  SUBJECTIVE STATEMENT: ***  *** Patient reports that she continues to have radicular symptoms in bilateral hands.  She does feel relief when she puts her hands over her head.  She continues to be compliant with home exercise program.   PERTINENT HISTORY:  PMHx includes migraines, thyroid disease, vitamin D deficiency, hyperlipidemia   PAIN:  Are you having pain? 4/10  ***   Per eval: NPRS scale: 2-8.5/10 Pain location: neck, BIL shoulders, headaches (usually unilateral from occipital to "temple") Pain description: throbbing, sharp, tightness  Aggravating factors: pillow placement  Relieving factors: meloxicam, ibuprofen   PRECAUTIONS: None  RED FLAGS: None    WEIGHT BEARING RESTRICTIONS: No  FALLS:  Has patient fallen in last 6 months? Yes. 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 Disc height loss at C5-C6 and C6-C7. No significant degenerative changes noted. No fractures or dislocations.   PATIENT SURVEYS:  FOTO 63 current, 67 predicted   COGNITION: Overall cognitive status: Within functional limits for tasks assessed  SENSATION: Not tested  POSTURE: mild forward head and rounded shoulders  PALPATION: Decreased cervical spine mobility with CPAs and UPA's throughout upper to mid cervical  spine bilaterally Increased muscle tension bilateral cervical and upper thoracic musculature  CERVICAL ROM:   Active ROM A/PROM (deg) eval  Flexion 60  Extension 45  Right lateral flexion 40  Left lateral flexion 30  Right rotation 58  Left rotation 56   (Blank rows = not tested)  UPPER EXTREMITY ROM: Grossly within functional limits bilaterally  UPPER EXTREMITY MMT: Grossly within functional limits bilaterally   CERVICAL SPECIAL TESTS:  Not tested   04/02/23 screen: + median nerve testing on RUE   TODAY'S TREATMENT:   [facility] Adult PT Treatment:                                                DATE: 05/10/23 Therapeutic Exercise: *** Manual Therapy: *** Neuromuscular re-ed: *** Therapeutic Activity: *** Modalities: *** Self Care: ***    [facility] Adult PT Treatment:                                                DATE: 04/25/23 Therapeutic Exercise: Median nerve glides RUE 2x10 (1 set prayer, 1 set tray) within comfortable ROM Seated cervical retraction x15 with 5 sec hold with ball against wall  Supine chin tuck 5 sec, 2 x10 Supine serratus press 1 x 10, 1 x 8  Supine serratus press with dowel, 3#, 2 x 5 Supine AAROM GH flexion w/ dowel 2# x15 green band row 2x10 cues for scap squeeze  Manual Therapy:  Manual cervical traction TPR to bilateral UT, suboccipitals and levator mm.   Manual UT/levator stretching BIL   [facility] Adult PT Treatment:                                                DATE: 04/18/23 Therapeutic Exercise: Median nerve glides RUE 2x10 (1 set prayer, 1 set tray) within comfortable ROM Seated cervical retraction x10 with 5 sec hold with ball against wall  Supine chin tuck  3 sec, 2 x10 Supine serratus press 2x8 cues for form and setup Supine AAROM w/ dowel 2#, GH flexion x12  Double ER + scapular retraction, red band 2x10 cues for comfortable ROM Red band row 2x10 cues for scap squeeze  Manual Therapy:  Manual cervical traction TPR to bilateral UT,  suboccipitals and levator mm.                                                                                                                               [facility] Adult PT Treatment:                                                DATE: 04/02/23 Therapeutic Exercise: Median nerve glides RUEx8 within comfortable ROM Seated cervical retraction x10 with 5 sec hold  Supine chin tuck x10 Supine serratus press 2x8 cues for form and setup Supine AAROM w/ dowel, GH flexion x12  Double ER + scapular retraction x8 unweighted, red band x8 cues for comfortable ROM Red band row 2x8 cues for scap squeeze HEP update + education, provided with red band   PATIENT EDUCATION:  Education details: rationale for interventions, HEP Person educated: Patient Education method: Explanation, Demonstration, and Handouts Education comprehension: verbalized understanding, returned demonstration, and needs further education  HOME EXERCISE PROGRAM: Access Code: [email or web address] URL: [email or web address] Date: 04/02/2023 Prepared by: Fransisco Hertz  Exercises - Seated Passive Cervical Retraction  - 2 x daily - 7 x weekly - 2 sets - 10 reps - 5 sec hold - Shoulder External Rotation and Scapular Retraction with Resistance  - 2 x daily - 7 x weekly - 1 sets - 8 reps  ASSESSMENT:  CLINICAL IMPRESSION: 05/09/2023 ***  *** Patient responded well to skilled physical therapy today, and had improved radicular symptoms following manual therapy.  She was able to tolerate some progression of exercises today, and will continue to benefit from ongoing physical therapy intervention to address remaining deficits.    OBJECTIVE IMPAIRMENTS: decreased activity tolerance, decreased endurance, decreased mobility, decreased strength, impaired UE functional use, postural dysfunction, and pain.   ACTIVITY LIMITATIONS: carrying, lifting, and sleeping  PARTICIPATION LIMITATIONS: meal prep, cleaning, driving, shopping, community  activity, and occupation  PERSONAL FACTORS: Past/current experiences, Profession, Time since onset of injury/illness/exacerbation, and 1-2 comorbidities: PMHx includes migraines, thyroid disease, vitamin D deficiency, hyperlipidemia  are also affecting patient's functional outcome.   REHAB POTENTIAL: Fair    CLINICAL DECISION MAKING: Evolving/moderate complexity  EVALUATION COMPLEXITY: Moderate   GOALS: Goals reviewed with patient? Yes  SHORT TERM GOALS: Target date: 04/26/2023   Patient will be independent with initial home program for cervical mobility.  Baseline: provided at eval  05/10/23: ***  Goal status: ***   2.  Patient will demonstrate improved postural awareness for at least 15 minutes while seated without need  for cueing from PT.   Baseline: mild forward head and rounded shoulders 05/10/23: ***  Goal status: ***   LONG TERM GOALS: Target date: 05/26/2023   Patient will report improved overall functional ability with FOTO score of 67 or greater.   Baseline: 63 current Goal status: INITIAL  2.  Patient will demonstrate improved CS AROM, including >/=40 degrees left lateral flexion.    Baseline:  Active ROM A/PROM (deg) eval  Flexion 60  Extension 45  Right lateral flexion 40  Left lateral flexion 30  Right rotation 58  Left rotation 56   Goal status: INITIAL   3.  Patient will report less frequent headaches to no more and 2-3 days a week.   Baseline: 4+ days Goal status: INITIAL  4.  Patient will report ability to tolerate full workday without exacerbation of cervical and radiating symptoms.  Baseline: Has not started new job at time of evaluation Goal status: INITIAL   PLAN:  PT FREQUENCY: 1-2x/week  PT DURATION: 8 weeks   PLANNED INTERVENTIONS: Therapeutic exercises, Therapeutic activity, Neuromuscular re-education, Patient/Family education, Self Care, Joint mobilization, Joint manipulation, Dry Needling, Electrical stimulation, Spinal  manipulation, Spinal mobilization, Cryotherapy, Moist heat, Manual therapy, and Re-evaluation  PLAN FOR NEXT SESSION: Consider manual therapy, trigger point dry needling, cervical spine active range of motion, passive mobilization, mobilization with movement, parascapular/postural endurance, modalities as indicated, manual/mechanical traction. Would likely benefit from increased practice with median nerve glides based on 04/02/23 visit    Ashley Murrain PT, DPT 05/09/2023 9:02 AM     Check all possible CPT codes: 69629 - PT Re-evaluation, 97110- Therapeutic Exercise, [phone]- Neuro Re-education, 97140 - Manual Therapy, 97530 - Therapeutic Activities, 97535 - Self Care, [phone] - Mechanical traction, 97014 - Electrical stimulation (unattended), and 97032 - Electrical stimulation (Manual)    Check all conditions that are expected to impact treatment: {Conditions expected to impact treatment:Neurological condition and/or seizures and Social determinants of health   If treatment provided at initial evaluation, no treatment charged due to lack of authorization.

## 2023-05-10 ENCOUNTER — Ambulatory Visit: Payer: Medicaid Other

## 2023-05-10 DIAGNOSIS — M542 Cervicalgia: Secondary | ICD-10-CM | POA: Diagnosis not present

## 2023-05-10 DIAGNOSIS — M5412 Radiculopathy, cervical region: Secondary | ICD-10-CM

## 2023-05-10 NOTE — Therapy (Signed)
OUTPATIENT PHYSICAL THERAPY NOTE   Patient Name: Cassandra Hughes MRN: [id number] DOB:May 22, 1971, 52 y.o., female Today's Date: 05/10/2023  END OF SESSION:  PT End of Session - 05/10/23 1442     Visit Number 5    Number of Visits 17    Date for PT Re-Evaluation 05/24/23    Authorization Type [employer]    Authorization Time Period auth after visit 27    PT Start Time 1445    PT Stop Time 1525    PT Time Calculation (min) 40 min    Activity Tolerance Patient tolerated treatment well;No increased pain    Behavior During Therapy [facility] for tasks assessed/performed             Past Medical History:  Diagnosis Date   Migraines    Thyroid disease    Past Surgical History:  Procedure Laterality Date   CESAREAN SECTION     CHOLECYSTECTOMY, LAPAROSCOPIC  01/2016   CYST REMOVAL NECK     PELVIC LAPAROSCOPY     fibroid    SKIN CANCER EXCISION N/A    TONSILLECTOMY AND ADENOIDECTOMY     Patient Active Problem List   Diagnosis Date Noted   Migraine without aura and with status migrainosus, not intractable 03/08/2023   Vitamin D deficiency 03/08/2023   Hyperlipidemia 03/08/2023   Acquired hypothyroidism 03/08/2023   Intramural leiomyoma of uterus 02/12/2017   DUB (dysfunctional uterine bleeding) 02/12/2017   Family history of cancer of gallbladder 10/15/2015    PCP: Salvatore Decent, FNP  REFERRING PROVIDER: Juanda Chance, NP  REFERRING DIAG: Cervicalgia [M54.2], Myofascial pain syndrome [M79.18], Cervicogenic headache [G44.86], Migraine without aura and with status migrainosus, not intractable [G43.001]   THERAPY DIAG:  Cervicalgia  Radiculopathy, cervical region  Rationale for Evaluation and Treatment: Rehabilitation  ONSET DATE: 1-2 years   SUBJECTIVE:                                                                                                                                                                                                        Per eval -  Pt presents to Pt d/t cervicalgia, and myofascial pain syndrome. She states that her neck pain has been present for years and is worsening lately. She often feels that her pain often moves from the base of her neck to her head or down into her shoulder. She has difficulty with positioning to sleep d/t pain. 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  Hand dominance: Right  SUBJECTIVE STATEMENT: Patient reports that she migraine this morning that is still slightly effecting her this afternoon. She states that she wears wrist braces at night which helps out with her hand numbness/tingling.    PERTINENT HISTORY:  PMHx includes migraines, thyroid disease, vitamin D deficiency, hyperlipidemia   PAIN:  Are you having pain? 4/10    Per eval: NPRS scale: 5/10 Pain location: neck, BIL shoulders, headaches (usually unilateral from occipital to "temple") Pain description: throbbing, sharp, tightness  Aggravating factors: pillow placement  Relieving factors: meloxicam, ibuprofen   PRECAUTIONS: None  RED FLAGS: None    WEIGHT BEARING RESTRICTIONS: No  FALLS:  Has patient fallen in last 6 months? Yes. 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 Disc height loss at C5-C6 and C6-C7. No significant degenerative changes noted. No fractures or dislocations.   PATIENT SURVEYS:  FOTO 63 current, 67 predicted   COGNITION: Overall cognitive status: Within functional limits for tasks assessed  SENSATION: Not tested  POSTURE: mild forward head and rounded shoulders  PALPATION: Decreased cervical spine mobility with CPAs and UPA's throughout upper to mid cervical spine bilaterally Increased muscle tension bilateral cervical and upper thoracic musculature  CERVICAL ROM:   Active ROM A/PROM (deg) eval  Flexion 60  Extension 45  Right lateral flexion 40  Left lateral flexion 30  Right rotation 58  Left rotation 56   (Blank rows = not tested)  UPPER EXTREMITY ROM: Grossly within functional limits bilaterally  UPPER EXTREMITY MMT: Grossly within functional limits bilaterally   CERVICAL SPECIAL TESTS:  Not tested   04/02/23 screen: + median nerve testing on RUE   TODAY'S TREATMENT:   [facility] Adult PT Treatment:                                                DATE: 05/10/23 Therapeutic Exercise: Median nerve glides RUE 2x10 (1 set prayer, 1 set tray) within comfortable ROM Standing cervical retraction 2x10 with 5 sec hold with ball against wall  Supine horizontal abduction RTB 2x8 Supine chin tuck 5 sec, 2 x10 Supine serratus press with dowel, 2#, 2 x 8 (some numbness in BIL hands) Supine AAROM GH flexion w/ dowel 2# 2x10 Manual Therapy:  Manual cervical traction TPR to bilateral UT, suboccipitals and levator mm.   Manual UT/levator stretching BIL    [facility] Adult PT Treatment:                                                DATE: 04/25/23 Therapeutic Exercise: Median nerve glides RUE 2x10 (1 set prayer, 1 set tray) within comfortable ROM Seated cervical retraction x15 with  5 sec hold with ball against wall  Supine chin tuck 5 sec, 2 x10 Supine serratus press 1 x 10, 1 x 8  Supine serratus press with  dowel, 3#, 2 x 5 Supine AAROM GH flexion w/ dowel 2# x15 green band row 2x10 cues for scap squeeze  Manual Therapy:  Manual cervical traction TPR to bilateral UT, suboccipitals and levator mm.   Manual UT/levator stretching BIL   [facility] Adult PT Treatment:                                                DATE: 04/18/23 Therapeutic Exercise: Median nerve glides RUE 2x10 (1 set prayer, 1 set tray) within comfortable ROM Seated cervical retraction x10 with 5 sec hold with ball against wall  Supine chin tuck 3 sec, 2 x10 Supine serratus press 2x8 cues for form and setup Supine AAROM w/ dowel 2#, GH flexion x12  Double ER + scapular retraction, red band 2x10 cues for comfortable ROM Red band row 2x10 cues for scap squeeze  Manual Therapy:  Manual cervical traction TPR to bilateral UT, suboccipitals and levator mm.                                                                                                                               PATIENT EDUCATION:  Education details: rationale for interventions, HEP Person educated: Patient Education method: Explanation, Demonstration, and Handouts Education comprehension: verbalized understanding, returned demonstration, and needs further education  HOME EXERCISE PROGRAM: Access Code: [email or web address] URL: [email or web address] Date: 04/02/2023 Prepared by: Fransisco Hertz  Exercises - Seated Passive Cervical Retraction  - 2 x daily - 7 x weekly - 2 sets - 10 reps - 5 sec hold - Shoulder External Rotation and Scapular Retraction with Resistance  - 2 x daily - 7 x weekly - 1 sets - 8 reps  ASSESSMENT:  CLINICAL IMPRESSION: Patient presents to PT reporting continued shoulder and neck pain and that she had a migraine this morning that is mostly gone, but still noticing some neck pain from this. Session today continued to focus on nerve glides and periscapular strengthening as well as manual techniques to decrease tension and tissue  restriction. She endorses some numbness/tingling in BIL hand with supine serratus punches that dissipates upon completion of exercise. Patient continues to benefit from skilled PT services and should be progressed as able to improve functional independence.    OBJECTIVE IMPAIRMENTS: decreased activity tolerance, decreased endurance, decreased mobility, decreased strength, impaired UE functional use, postural dysfunction, and pain.   ACTIVITY LIMITATIONS: carrying, lifting, and sleeping  PARTICIPATION LIMITATIONS: meal prep, cleaning, driving, shopping, community activity, and occupation  PERSONAL FACTORS: Past/current experiences, Profession, Time since onset of injury/illness/exacerbation, and 1-2 comorbidities: PMHx includes migraines, thyroid disease, vitamin D deficiency, hyperlipidemia  are also affecting patient's functional outcome.  REHAB POTENTIAL: Fair    CLINICAL DECISION MAKING: Evolving/moderate complexity  EVALUATION COMPLEXITY: Moderate   GOALS: Goals reviewed with patient? Yes  SHORT TERM GOALS: Target date: 04/26/2023   Patient will be independent with initial home program for cervical mobility.  Baseline: provided at eval  Goal status: INITIAL  2.  Patient will demonstrate improved postural awareness for at least 15 minutes while seated without need for cueing from PT.   Baseline: mild forward head and rounded shoulders Goal status: INITIAL  LONG TERM GOALS: Target date: 05/26/2023   Patient will report improved overall functional ability with FOTO score of 67 or greater.   Baseline: 63 current Goal status: INITIAL  2.  Patient will demonstrate improved CS AROM, including >/=40 degrees left lateral flexion.    Baseline:  Active ROM A/PROM (deg) eval  Flexion 60  Extension 45  Right lateral flexion 40  Left lateral flexion 30  Right rotation 58  Left rotation 56   Goal status: INITIAL   3.  Patient will report less frequent headaches to no more  and 2-3 days a week.   Baseline: 4+ days Goal status: INITIAL  4.  Patient will report ability to tolerate full workday without exacerbation of cervical and radiating symptoms.  Baseline: Has not started new job at time of evaluation Goal status: INITIAL   PLAN:  PT FREQUENCY: 1-2x/week  PT DURATION: 8 weeks   PLANNED INTERVENTIONS: Therapeutic exercises, Therapeutic activity, Neuromuscular re-education, Patient/Family education, Self Care, Joint mobilization, Joint manipulation, Dry Needling, Electrical stimulation, Spinal manipulation, Spinal mobilization, Cryotherapy, Moist heat, Manual therapy, and Re-evaluation  PLAN FOR NEXT SESSION: Consider manual therapy, trigger point dry needling, cervical spine active range of motion, passive mobilization, mobilization with movement, parascapular/postural endurance, modalities as indicated, manual/mechanical traction. Would likely benefit from increased practice with median nerve glides based on 04/02/23 visit    Berta Minor PTA 05/10/2023 3:31 PM     Check all possible CPT codes: 16109 - PT Re-evaluation, 97110- Therapeutic Exercise, [phone]- Neuro Re-education, 97140 - Manual Therapy, 97530 - Therapeutic Activities, 97535 - Self Care, [phone] - Mechanical traction, 97014 - Electrical stimulation (unattended), and 97032 - Electrical stimulation (Manual)    Check all conditions that are expected to impact treatment: {Conditions expected to impact treatment:Neurological condition and/or seizures and Social determinants of health   If treatment provided at initial evaluation, no treatment charged due to lack of authorization.

## 2023-05-11 ENCOUNTER — Telehealth: Payer: Self-pay

## 2023-05-11 NOTE — Telephone Encounter (Signed)
FYI, [facility] called stating that insurance denied her cspine MRI

## 2023-05-13 ENCOUNTER — Other Ambulatory Visit: Payer: Medicaid Other

## 2023-05-17 ENCOUNTER — Ambulatory Visit: Payer: Medicaid Other | Attending: Physical Medicine and Rehabilitation

## 2023-05-17 DIAGNOSIS — M5412 Radiculopathy, cervical region: Secondary | ICD-10-CM | POA: Diagnosis not present

## 2023-05-17 DIAGNOSIS — M542 Cervicalgia: Secondary | ICD-10-CM | POA: Insufficient documentation

## 2023-05-17 NOTE — Therapy (Signed)
OUTPATIENT PHYSICAL THERAPY NOTE   Patient Name: Cassandra Hughes MRN: [id number] DOB:[birth year], 52 y.o., female Today's Date: 05/17/2023  END OF SESSION:  PT End of Session - 05/17/23 1439     Visit Number 6    Number of Visits 17    Date for PT Re-Evaluation 05/24/23    Authorization Type [employer]    Authorization Time Period auth after visit 27    PT Start Time 1445    PT Stop Time 1526    PT Time Calculation (min) 41 min    Activity Tolerance Patient tolerated treatment well;No increased pain    Behavior During Therapy [facility] for tasks assessed/performed              Past Medical History:  Diagnosis Date   Migraines    Thyroid disease    Past Surgical History:  Procedure Laterality Date   CESAREAN SECTION     CHOLECYSTECTOMY, LAPAROSCOPIC  01/2016   CYST REMOVAL NECK     PELVIC LAPAROSCOPY     fibroid    SKIN CANCER EXCISION N/A    TONSILLECTOMY AND ADENOIDECTOMY     Patient Active Problem List   Diagnosis Date Noted   Migraine without aura and with status migrainosus, not intractable 03/08/2023   Vitamin D deficiency 03/08/2023   Hyperlipidemia 03/08/2023   Acquired hypothyroidism 03/08/2023   Intramural leiomyoma of uterus 02/12/2017   DUB (dysfunctional uterine bleeding) 02/12/2017   Family history of cancer of gallbladder 10/15/2015    PCP: Salvatore Decent, FNP  REFERRING PROVIDER: Juanda Chance, NP  REFERRING DIAG: Cervicalgia [M54.2], Myofascial pain syndrome [M79.18], Cervicogenic headache [G44.86], Migraine without aura and with status migrainosus, not intractable [G43.001]   THERAPY DIAG:  Cervicalgia  Radiculopathy, cervical region  Rationale for Evaluation and Treatment: Rehabilitation  ONSET DATE: 1-2 years   SUBJECTIVE:                                                                                                                                                                                                        Per eval  - Pt presents to Pt d/t cervicalgia, and myofascial pain syndrome. She states that her neck pain has been present for years and is worsening lately. She often feels that her pain often moves from the base of her neck to her head or down into her shoulder. She has difficulty with positioning to sleep d/t pain. 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   Hand dominance: Right  SUBJECTIVE STATEMENT: Patient reports that the right side of her shoulder and neck are bothering her today and that she had to lift several heavy things at work today.    PERTINENT HISTORY:  PMHx includes migraines, thyroid disease, vitamin D deficiency, hyperlipidemia   PAIN:  Are you having pain? 4/10    Per eval: NPRS scale: 5/10 Pain location: neck, BIL shoulders, headaches (usually unilateral from occipital to "temple") Pain description: throbbing, sharp, tightness  Aggravating factors: pillow placement  Relieving factors: meloxicam, ibuprofen   PRECAUTIONS: None  RED FLAGS: None    WEIGHT BEARING RESTRICTIONS: No  FALLS:  Has patient fallen in last 6 months? Yes. 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 Disc height loss at C5-C6 and C6-C7. No  significant degenerative changes noted. No fractures or dislocations.   PATIENT SURVEYS:  FOTO 63 current, 67 predicted   COGNITION: Overall cognitive status: Within functional limits for tasks assessed  SENSATION: Not tested  POSTURE: mild forward head and rounded shoulders  PALPATION: Decreased cervical spine mobility with CPAs and UPA's throughout upper to mid cervical spine bilaterally Increased muscle tension bilateral cervical and upper thoracic musculature  CERVICAL ROM:   Active ROM A/PROM (deg) eval  Flexion 60  Extension 45  Right lateral flexion 40  Left lateral flexion 30  Right rotation 58  Left rotation 56   (Blank rows = not tested)  UPPER EXTREMITY ROM: Grossly within functional limits bilaterally  UPPER EXTREMITY MMT: Grossly within functional limits bilaterally   CERVICAL SPECIAL TESTS:  Not tested   04/02/23 screen: + median nerve testing on RUE   TODAY'S TREATMENT:   [facility] Adult PT Treatment:                                                DATE: 05/17/23 Therapeutic Exercise: UBE level 1 x 3'/3' while gathering subjective info Median nerve glides BIL 2x10 (1 set prayer, 1 set tray) within comfortable ROM Standing cervical retraction 2x10 with 5 sec hold with ball against wall  Supine horizontal abduction RTB 2x8 Supine chin tuck 5 sec, 2x10 Supine serratus press with dowel, 2#, 2x8 (some numbness in BIL hands) Supine AAROM GH flexion w/ dowel 2# x10, x6 (increased N/T) Manual Therapy:  Manual cervical traction TPR to bilateral UT, suboccipitals and levator mm.   Manual UT/levator stretching BIL Self Care: Theracane self instruction   [facility] Adult PT Treatment:                                                DATE: 05/10/23 Therapeutic Exercise: Median nerve glides RUE 2x10 (1 set  prayer, 1 set tray) within comfortable ROM Standing cervical retraction 2x10 with 5 sec hold with ball against wall  Supine horizontal abduction RTB 2x8 Supine chin  tuck 5 sec, 2 x10 Supine serratus press with dowel, 2#, 2 x 8 (some numbness in BIL hands) Supine AAROM GH flexion w/ dowel 2# 2x10 Manual Therapy:  Manual cervical traction TPR to bilateral UT, suboccipitals and levator mm.   Manual UT/levator stretching BIL    [facility] Adult PT Treatment:                                                DATE: 04/25/23 Therapeutic Exercise: Median nerve glides RUE 2x10 (1 set prayer, 1 set tray) within comfortable ROM Seated cervical retraction x15 with 5 sec hold with ball against wall  Supine chin tuck 5 sec, 2 x10 Supine serratus press 1 x 10, 1 x 8  Supine serratus press with dowel, 3#, 2 x 5 Supine AAROM GH flexion w/ dowel 2# x15 green band row 2x10 cues for scap squeeze  Manual Therapy:  Manual cervical traction TPR to bilateral UT, suboccipitals and levator mm.   Manual UT/levator stretching BIL                                                                                                                               PATIENT EDUCATION:  Education details: rationale for interventions, HEP Person educated: Patient Education method: Explanation, Demonstration, and Handouts Education comprehension: verbalized understanding, returned demonstration, and needs further education  HOME EXERCISE PROGRAM: Access Code: [email or web address] URL: [email or web address] Date: 04/02/2023 Prepared by: Fransisco Hertz  Exercises - Seated Passive Cervical Retraction  - 2 x daily - 7 x weekly - 2 sets - 10 reps - 5 sec hold - Shoulder External Rotation and Scapular Retraction with Resistance  - 2 x daily - 7 x weekly - 1 sets - 8 reps  ASSESSMENT:  CLINICAL IMPRESSION: Patient presents to PT reporting increased Rt shoulder/neck pain today due to heavy lifting at work. Session today continued to focus on periscapular strengthening and nerve glides to decrease pain and peripheral symptoms. She is interested in TPDN if her POC is extended next week. She  states that the wrist braces she uses at night are very helpful. Patient was able to tolerate all prescribed exercises with no adverse effects. Patient continues to benefit from skilled PT services and should be progressed as able to improve functional independence.    OBJECTIVE IMPAIRMENTS: decreased activity tolerance, decreased endurance, decreased mobility, decreased strength, impaired UE functional use, postural dysfunction, and pain.   ACTIVITY LIMITATIONS: carrying, lifting, and sleeping  PARTICIPATION LIMITATIONS: meal prep, cleaning, driving, shopping, community activity, and occupation  PERSONAL FACTORS: Past/current experiences, Profession, Time since onset of injury/illness/exacerbation, and 1-2 comorbidities: PMHx includes migraines, thyroid disease, vitamin D deficiency,  hyperlipidemia  are also affecting patient's functional outcome.   REHAB POTENTIAL: Fair    CLINICAL DECISION MAKING: Evolving/moderate complexity  EVALUATION COMPLEXITY: Moderate   GOALS: Goals reviewed with patient? Yes  SHORT TERM GOALS: Target date: 04/26/2023   Patient will be independent with initial home program for cervical mobility.  Baseline: provided at eval  Goal status: INITIAL  2.  Patient will demonstrate improved postural awareness for at least 15 minutes while seated without need for cueing from PT.   Baseline: mild forward head and rounded shoulders Goal status: INITIAL  LONG TERM GOALS: Target date: 05/26/2023   Patient will report improved overall functional ability with FOTO score of 67 or greater.   Baseline: 63 current Goal status: INITIAL  2.  Patient will demonstrate improved CS AROM, including >/=40 degrees left lateral flexion.    Baseline:  Active ROM A/PROM (deg) eval  Flexion 60  Extension 45  Right lateral flexion 40  Left lateral flexion 30  Right rotation 58  Left rotation 56   Goal status: INITIAL   3.  Patient will report less frequent headaches to  no more and 2-3 days a week.   Baseline: 4+ days Goal status: INITIAL  4.  Patient will report ability to tolerate full workday without exacerbation of cervical and radiating symptoms.  Baseline: Has not started new job at time of evaluation Goal status: INITIAL   PLAN:  PT FREQUENCY: 1-2x/week  PT DURATION: 8 weeks   PLANNED INTERVENTIONS: Therapeutic exercises, Therapeutic activity, Neuromuscular re-education, Patient/Family education, Self Care, Joint mobilization, Joint manipulation, Dry Needling, Electrical stimulation, Spinal manipulation, Spinal mobilization, Cryotherapy, Moist heat, Manual therapy, and Re-evaluation  PLAN FOR NEXT SESSION: Consider manual therapy, trigger point dry needling, cervical spine active range of motion, passive mobilization, mobilization with movement, parascapular/postural endurance, modalities as indicated, manual/mechanical traction. Would likely benefit from increased practice with median nerve glides based on 04/02/23 visit    Berta Minor PTA 05/17/2023 3:26 PM     Check all possible CPT codes: 40102 - PT Re-evaluation, 97110- Therapeutic Exercise, [id number]- Neuro Re-education, 97140 - Manual Therapy, 97530 - Therapeutic Activities, 97535 - Self Care, [phone] - Mechanical traction, 97014 - Electrical stimulation (unattended), and 97032 - Electrical stimulation (Manual)    Check all conditions that are expected to impact treatment: {Conditions expected to impact treatment:Neurological condition and/or seizures and Social determinants of health   If treatment provided at initial evaluation, no treatment charged due to lack of authorization.

## 2023-05-18 NOTE — Telephone Encounter (Signed)
I called to see if can do reconsideration and was told they are needing doctors notes proving pt had six weeks of PT in last six months. I sent all the pertinent information back to [employer]. Pending approval

## 2023-05-20 ENCOUNTER — Other Ambulatory Visit: Payer: Self-pay | Admitting: Nurse Practitioner

## 2023-05-20 DIAGNOSIS — Z3041 Encounter for surveillance of contraceptive pills: Secondary | ICD-10-CM

## 2023-05-21 NOTE — Telephone Encounter (Signed)
Med refill request: Valentino Hue Last AEX: 08/22/22 Next AEX: not scheduled Last MMG (if hormonal med) 10/13/22 Refill authorized: Please Advise, #[phone], 3 RF

## 2023-05-24 ENCOUNTER — Ambulatory Visit: Payer: Medicaid Other

## 2023-05-24 DIAGNOSIS — M542 Cervicalgia: Secondary | ICD-10-CM

## 2023-05-24 DIAGNOSIS — M5412 Radiculopathy, cervical region: Secondary | ICD-10-CM | POA: Diagnosis not present

## 2023-05-24 NOTE — Therapy (Signed)
OUTPATIENT PHYSICAL THERAPY NOTE   Patient Name: Cassandra Hughes MRN: [id number] DOB:1970-09-10, 52 y.o., female Today's Date: 05/24/2023  END OF SESSION:  PT End of Session - 05/24/23 1447     Visit Number 7    Number of Visits 17    Date for PT Re-Evaluation 05/24/23    Authorization Type [employer]    Authorization Time Period auth after visit 27    PT Start Time 1447    PT Stop Time 1512    PT Time Calculation (min) 25 min    Activity Tolerance Patient tolerated treatment well;No increased pain    Behavior During Therapy [facility] for tasks assessed/performed               Past Medical History:  Diagnosis Date   Migraines    Thyroid disease    Past Surgical History:  Procedure Laterality Date   CESAREAN SECTION     CHOLECYSTECTOMY, LAPAROSCOPIC  01/2016   CYST REMOVAL NECK     PELVIC LAPAROSCOPY     fibroid    SKIN CANCER EXCISION N/A    TONSILLECTOMY AND ADENOIDECTOMY     Patient Active Problem List   Diagnosis Date Noted   Migraine without aura and with status migrainosus, not intractable 03/08/2023   Vitamin D deficiency 03/08/2023   Hyperlipidemia 03/08/2023   Acquired hypothyroidism 03/08/2023   Intramural leiomyoma of uterus 02/12/2017   DUB (dysfunctional uterine bleeding) 02/12/2017   Family history of cancer of gallbladder 10/15/2015    PCP: Salvatore Decent, FNP  REFERRING PROVIDER: Juanda Chance, NP  REFERRING DIAG: Cervicalgia [M54.2], Myofascial pain syndrome [M79.18], Cervicogenic headache [G44.86], Migraine without aura and with status migrainosus, not intractable [G43.001]   THERAPY DIAG:  Radiculopathy, cervical region  Cervicalgia  Rationale for Evaluation and Treatment: Rehabilitation  ONSET DATE: 1-2 years   SUBJECTIVE:                                                                                                                                                                                                        Per  eval - Pt presents to Pt d/t cervicalgia, and myofascial pain syndrome. She states that her neck pain has been present for years and is worsening lately. She often feels that her pain often moves from the base of her neck to her head or down into her shoulder. She has difficulty with positioning to sleep d/t pain. 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   Hand dominance: Right  SUBJECTIVE STATEMENT:  Patient is reporting that she has noticing some improvement overall symptoms, including ease of [cervical] movement. She states that she has had 2 migraines this week, which is more frequent than usual. She states that it is possible that the increased frequency are related to atmospheric changes. She is also noting that she continues to have constant pain.   PERTINENT HISTORY:  PMHx includes migraines, thyroid disease, vitamin D deficiency, hyperlipidemia   PAIN:  As of 05/24/23 Are you having pain? 7.5/10 worst, 2/10 best, 7/10 current (with concurrent migraine)    Per eval: NPRS scale: 5/10 Pain location: neck, BIL shoulders, headaches (usually unilateral from occipital to "temple") Pain description: throbbing, sharp, tightness  Aggravating factors: pillow placement  Relieving factors: meloxicam, ibuprofen   PRECAUTIONS: None  RED FLAGS: None    WEIGHT BEARING RESTRICTIONS: No  FALLS:  Has patient fallen in last 6 months? Yes. 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 Disc height loss at C5-C6 and C6-C7. No significant degenerative changes noted. No fractures or dislocations.   PATIENT SURVEYS:  FOTO 63 current, 67 predicted   COGNITION: Overall cognitive status: Within functional limits for tasks assessed  SENSATION: Not tested  POSTURE: mild forward head and rounded shoulders  PALPATION: Decreased cervical spine mobility with CPAs and UPA's throughout upper to mid cervical spine bilaterally Increased muscle tension bilateral cervical and upper thoracic musculature  CERVICAL ROM:   Active ROM AROM (deg) eval AROM  05/24/23  Flexion 60 60  Extension 45 60  Right lateral flexion 40 40  Left lateral flexion 30 40  Right rotation 58 60  Left rotation 56 55   (Blank rows = not tested)  UPPER EXTREMITY ROM: Grossly within functional limits bilaterally  UPPER EXTREMITY MMT: Grossly within functional limits bilaterally   CERVICAL SPECIAL TESTS:  Not tested   04/02/23 screen: + median nerve testing on RUE   TODAY'S TREATMENT:      [facility] Adult PT Treatment:                                                DATE: 05/24/2023  Therapeutic Activity:  Reassessment of objective measures and subjective assessment regarding progress towards established goals and plan for independence with prescribed home program following discharged from PT    [facility] Adult PT Treatment:                                                DATE: 05/17/23 Therapeutic Exercise: UBE level 1 x 3'/3' while gathering subjective info Median nerve glides BIL 2x10 (1 set prayer, 1 set tray)  within comfortable ROM Standing cervical retraction 2x10 with 5 sec hold with ball against wall  Supine horizontal abduction  RTB 2x8 Supine chin tuck 5 sec, 2x10 Supine serratus press with dowel, 2#, 2x8 (some numbness in BIL hands) Supine AAROM GH flexion w/ dowel 2# x10, x6 (increased N/T)  Manual Therapy:  Manual cervical traction TPR to bilateral UT, suboccipitals and levator mm.   Manual UT/levator stretching BIL  Self Care: Theracane self instruction                                                                PATIENT EDUCATION:  Education details: rationale for interventions, HEP Person educated: Patient Education method: Explanation, Demonstration, and Handouts Education comprehension: verbalized understanding, returned demonstration, and needs further education  HOME EXERCISE PROGRAM: Access Code: [email or web address] URL: [email or web address] Date: 05/24/2023 Prepared by: Mauri Reading  Exercises - Seated Passive Cervical Retraction  - 2 x daily - 7 x weekly - 2 sets - 10 reps - 5 sec hold - Shoulder External Rotation and Scapular Retraction with Resistance  - 2 x daily - 7 x weekly - 1 sets - 8 reps - Median Nerve Flossing - Tray  - 2 x daily - 7 x weekly - 2 sets - 10 reps - Ulnar Nerve Flossing  - 2 x daily - 7 x weekly - 2 sets - 10 reps  ASSESSMENT:  CLINICAL IMPRESSION: Patient has attended 7 total PT sessions and been compliant with prescribed home program. At this time, she has met all of her established goals and will benefit from ongoing independence with prescribed home program to maintain current progress. She continues to have constant low level pain and headaches. Plan is for patient to return to PT in the event that she begins to have more difficulty with daily activities. However, she will be discharged at this time form skilled PT to return to care of referring provider.     OBJECTIVE IMPAIRMENTS: decreased activity tolerance, decreased endurance, decreased mobility, decreased strength, impaired UE functional use, postural dysfunction, and pain.   ACTIVITY  LIMITATIONS: carrying, lifting, and sleeping  PARTICIPATION LIMITATIONS: meal prep, cleaning, driving, shopping, community activity, and occupation  PERSONAL FACTORS: Past/current experiences, Profession, Time since onset of injury/illness/exacerbation, and 1-2 comorbidities: PMHx includes migraines, thyroid disease, vitamin D deficiency, hyperlipidemia  are also affecting patient's functional outcome.   REHAB POTENTIAL: Fair    CLINICAL DECISION MAKING: Evolving/moderate complexity  EVALUATION COMPLEXITY: Moderate   GOALS: Goals reviewed with patient? Yes  SHORT TERM GOALS: Target date: 04/26/2023   Patient will be independent with initial home program for cervical mobility.  Baseline: provided at eval  Goal status: MET  2.  Patient will demonstrate improved postural awareness for at least 15 minutes while seated without need for cueing from PT.   Baseline: mild forward head and rounded shoulders Goal status: MET  LONG TERM GOALS: Target date: 05/26/2023   Patient will report improved overall functional ability with FOTO score of 67 or greater.   Baseline: 63 current 05/24/23: 66  Goal status: nearly met   2.  Patient will demonstrate improved CS AROM, including >/=40 degrees left lateral flexion.    Baseline:  Active ROM AROM (deg) eval AROM  05/24/23  Flexion 60 60  Extension 45  60  Right lateral flexion 40 40  Left lateral flexion 30 40  Right rotation 58 60  Left rotation 56 55   Goal status: MET   3.  Patient will report less frequent headaches to no more and 2-3 days a week.   Baseline: 4+ days 05/24/23: 2 headache/week  Goal status: MET  4.  Patient will report ability to tolerate full workday without exacerbation of cervical and radiating symptoms.  Baseline: Has not started new job at time of evaluation 05/24/23: "on an average day" pt states  Goal status: MET -  PLAN:  PT FREQUENCY: 1-2x/week  PT DURATION: 8 weeks   PLANNED INTERVENTIONS:  Therapeutic exercises, Therapeutic activity, Neuromuscular re-education, Patient/Family education, Self Care, Joint mobilization, Joint manipulation, Dry Needling, Electrical stimulation, Spinal manipulation, Spinal mobilization, Cryotherapy, Moist heat, Manual therapy, and Re-evaluation  PLAN FOR NEXT SESSION: Consider manual therapy, trigger point dry needling, cervical spine active range of motion, passive mobilization, mobilization with movement, parascapular/postural endurance, modalities as indicated, manual/mechanical traction. Would likely benefit from increased practice with median nerve glides based on 04/02/23 visit    Mauri Reading, PT, DPT  05/24/2023 3:25 PM     Check all possible CPT codes: 81191 - PT Re-evaluation, 97110- Therapeutic Exercise, [phone]- Neuro Re-education, 97140 - Manual Therapy, 97530 - Therapeutic Activities, 97535 - Self Care, [phone] - Mechanical traction, 97014 - Electrical stimulation (unattended), and 97032 - Electrical stimulation (Manual)    Check all conditions that are expected to impact treatment: {Conditions expected to impact treatment:Neurological condition and/or seizures and Social determinants of health   If treatment provided at initial evaluation, no treatment charged due to lack of authorization.

## 2023-06-18 ENCOUNTER — Encounter: Payer: Medicaid Other | Admitting: Internal Medicine

## 2023-06-19 DIAGNOSIS — D23122 Other benign neoplasm of skin of left lower eyelid, including canthus: Secondary | ICD-10-CM | POA: Diagnosis not present

## 2023-06-25 ENCOUNTER — Encounter: Payer: Self-pay | Admitting: Internal Medicine

## 2023-06-25 ENCOUNTER — Ambulatory Visit (INDEPENDENT_AMBULATORY_CARE_PROVIDER_SITE_OTHER): Payer: Medicaid Other | Admitting: Internal Medicine

## 2023-06-25 VITALS — BP 116/78 | HR 74 | Temp 98.1°F | Ht 64.0 in | Wt 187.4 lb

## 2023-06-25 DIAGNOSIS — Z0000 Encounter for general adult medical examination without abnormal findings: Secondary | ICD-10-CM | POA: Diagnosis not present

## 2023-06-25 DIAGNOSIS — E559 Vitamin D deficiency, unspecified: Secondary | ICD-10-CM

## 2023-06-25 LAB — CBC WITH DIFFERENTIAL/PLATELET
Basophils Absolute: 0.1 10*3/uL (ref 0.0–0.1)
Basophils Relative: 1.2 % (ref 0.0–3.0)
Eosinophils Absolute: 0.1 10*3/uL (ref 0.0–0.7)
Eosinophils Relative: 1.8 % (ref 0.0–5.0)
HCT: 42.7 % (ref 36.0–46.0)
Hemoglobin: 14.7 g/dL (ref 12.0–15.0)
Lymphocytes Relative: 34.7 % (ref 12.0–46.0)
Lymphs Abs: 2.3 10*3/uL (ref 0.7–4.0)
MCHC: 34.4 g/dL (ref 30.0–36.0)
MCV: 90.6 fL (ref 78.0–100.0)
Monocytes Absolute: 0.4 10*3/uL (ref 0.1–1.0)
Monocytes Relative: 6 % (ref 3.0–12.0)
Neutro Abs: 3.7 10*3/uL (ref 1.4–7.7)
Neutrophils Relative %: 56.3 % (ref 43.0–77.0)
Platelets: 199 10*3/uL (ref 150.0–400.0)
RBC: 4.71 Mil/uL (ref 3.87–5.11)
RDW: 12.3 % (ref 11.5–15.5)
WBC: 6.5 10*3/uL (ref 4.0–10.5)

## 2023-06-25 LAB — LIPID PANEL
Cholesterol: 215 mg/dL — ABNORMAL HIGH (ref 0–200)
HDL: 45.3 mg/dL (ref >39.00)
LDL Cholesterol: 137 mg/dL — ABNORMAL HIGH (ref 0–99)
NonHDL: 170.06
Total CHOL/HDL Ratio: 5
Triglycerides: 167 mg/dL — ABNORMAL HIGH (ref 0.0–149.0)
VLDL: 33.4 mg/dL (ref 0.0–40.0)

## 2023-06-25 LAB — COMPREHENSIVE METABOLIC PANEL
ALT: 16 U/L (ref 0–35)
AST: 15 U/L (ref 0–37)
Albumin: 4.3 g/dL (ref 3.5–5.2)
Alkaline Phosphatase: 68 U/L (ref 39–117)
BUN: 10 mg/dL (ref 6–23)
CO2: 26 meq/L (ref 19–32)
Calcium: 9.2 mg/dL (ref 8.4–10.5)
Chloride: 103 meq/L (ref 96–112)
Creatinine, Ser: 0.76 mg/dL (ref 0.40–1.20)
GFR: 90.06 mL/min (ref >60.00)
Glucose, Bld: 79 mg/dL (ref 70–99)
Potassium: 4 meq/L (ref 3.5–5.1)
Sodium: 138 meq/L (ref 135–145)
Total Bilirubin: 0.7 mg/dL (ref 0.2–1.2)
Total Protein: 6.6 g/dL (ref 6.0–8.3)

## 2023-06-25 LAB — VITAMIN D 25 HYDROXY (VIT D DEFICIENCY, FRACTURES): VITD: 44.4 ng/mL (ref 30.00–100.00)

## 2023-06-25 LAB — TSH: TSH: 1.16 u[IU]/mL (ref 0.35–5.50)

## 2023-06-25 NOTE — Progress Notes (Signed)
Subjective:   Cassandra Hughes 1971/04/14  06/25/2023   CC: Chief Complaint  Patient presents with   Annual Exam    HPI: Cassandra Hughes is a 52 y.o. female who presents for a routine health maintenance exam.  Labs collected at time of visit.   Hx of vitamin d deficiency, she completed prescription of Vit. D, now taking 2000 units once daily. Will recheck today.    HEALTH SCREENINGS: - Pap smear: up to date - Mammogram (40+): Up to date  - Colonoscopy (45+): Up to date - 2 years ago at [facility] - Bone Density (65+): Not applicable  - Lung CA screening with low-dose CT:  Not applicable Adults age 37-80 who are current cigarette smokers or quit within the last 15 years. Must have 20 pack year history.   Depression and Anxiety Screen done today and results listed below:     06/25/2023    1:18 PM 03/08/2023    8:21 AM 06/19/2016    3:21 PM 05/08/2016   12:12 PM  Depression screen PHQ 2/9  Decreased Interest 0 0 0 0  Down, Depressed, Hopeless 0 0 0 0  PHQ - 2 Score 0 0 0 0  Altered sleeping 0 0    Tired, decreased energy 1 0    Change in appetite 0 0    Feeling bad or failure about yourself  0 0    Trouble concentrating 0 0    Moving slowly or fidgety/restless 0 0    Suicidal thoughts 0 0    PHQ-9 Score 1 0    Difficult doing work/chores Not difficult at all Not difficult at all        06/25/2023    1:18 PM 03/08/2023    8:21 AM  GAD 7 : Generalized Anxiety Score  Nervous, Anxious, on Edge 1 0  Control/stop worrying 0 0  Worry too much - different things 1 0  Trouble relaxing 0 0  Restless 0 0  Easily annoyed or irritable 0 0  Afraid - awful might happen 0 0  Total GAD 7 Score 2 0  Anxiety Difficulty Somewhat difficult Not difficult at all    IMMUNIZATIONS: - Tdap: Tetanus vaccination status reviewed: last tetanus booster within 10 years. - HPV: Not applicable - Influenza: Up to date - Zostavax (50+): Up to date - received at [facility] in  [facility]  (requesting records)    Past medical history, surgical history, medications, allergies, family history and social history reviewed with patient today and changes made to appropriate areas of the chart.   Past Medical History:  Diagnosis Date   Migraines    Thyroid disease     Past Surgical History:  Procedure Laterality Date   CESAREAN SECTION     CHOLECYSTECTOMY, LAPAROSCOPIC  01/2016   CYST REMOVAL NECK     PELVIC LAPAROSCOPY     fibroid    SKIN CANCER EXCISION N/A    TONSILLECTOMY AND ADENOIDECTOMY      Current Outpatient Medications on File Prior to Visit  Medication Sig   Cholecalciferol (VITAMIN D3) 1.25 MG (50000 UT) CAPS Take 1 capsule (1.25 mg total) by mouth once a week.   levonorgestrel-ethinyl estradiol (LESSINA-28) 0.1-20 MG-MCG tablet TAKE 1 TABLET BY MOUTH DAILY SKIP PLACEBO   methocarbamol (ROBAXIN) 500 MG tablet Take 1 tablet (500 mg total) by mouth 3 (three) times daily.   SUMAtriptan (IMITREX) 50 MG tablet Take 1 tablet (50 mg total) by mouth every 2 (two)  hours as needed for migraine. May repeat in 2 hours if headache persists or recurs.   thyroid (ARMOUR) 120 MG tablet as directed.   thyroid (ARMOUR) 30 MG tablet as directed.   meloxicam (MOBIC) 15 MG tablet Take 1 tablet (15 mg total) by mouth daily. (Patient not taking: Reported on 06/25/2023)   triamcinolone cream (KENALOG) 0.1 % Apply to affected area two times a day for 2 weeks. (Patient not taking: Reported on 03/28/2023)   No current facility-administered medications on file prior to visit.    Allergies  Allergen Reactions   Vicodin [Hydrocodone-Acetaminophen] Nausea Only     Social History   Socioeconomic History   Marital status: Single    Spouse name: Not on file   Number of children: Not on file   Years of education: Not on file   Highest education level: Associate degree: occupational, Scientist, product/process development, or vocational program  Occupational History   Not on file  Tobacco Use    Smoking status: Former    Types: Cigarettes   Smokeless tobacco: Never  Vaping Use   Vaping status: Never Used  Substance and Sexual Activity   Alcohol use: Yes    Comment: rarely   Drug use: No   Sexual activity: Not Currently    Birth control/protection: OCP    Comment: First IC >16 y/o, 5-7 Partners, Hx of +TV  Other Topics Concern   Not on file  Social History Narrative   Not on file   Social Determinants of Health   Financial Resource Strain: Low Risk  (06/22/2023)   Overall Financial Resource Strain (CARDIA)    Difficulty of Paying Living Expenses: Not very hard  Food Insecurity: No Food Insecurity (06/22/2023)   Hunger Vital Sign    Worried About Running Out of Food in the Last Year: Never true    Ran Out of Food in the Last Year: Never true  Transportation Needs: No Transportation Needs (06/22/2023)   PRAPARE - Administrator, Civil Service (Medical): No    Lack of Transportation (Non-Medical): No  Physical Activity: Insufficiently Active (06/22/2023)   Exercise Vital Sign    Days of Exercise per Week: 3 days    Minutes of Exercise per Session: 30 min  Stress: No Stress Concern Present (06/22/2023)   [employer] of Occupational Health - Occupational Stress Questionnaire    Feeling of Stress : Only a little  Social Connections: Unknown (06/22/2023)   Social Connection and Isolation Panel [NHANES]    Frequency of Communication with Friends and Family: Patient declined    Frequency of Social Gatherings with Friends and Family: Once a week    Attends Religious Services: Never    Database administrator or Organizations: No    Attends Engineer, structural: Not on file    Marital Status: Patient declined  Intimate Partner Violence: Unknown (11/18/2021)   Received from [facility], [facility]   HITS    Physically Hurt: Not on file    Insult or Talk Down To: Not on file    Threaten Physical Harm: Not on file    Scream or Curse: Not on file    Social History   Tobacco Use  Smoking Status Former   Types: Cigarettes  Smokeless Tobacco Never   Social History   Substance and Sexual Activity  Alcohol Use Yes   Comment: rarely    Family History  Problem Relation Age of Onset   Cancer Mother  gallbladder    Cancer Father        prostate   Cancer Maternal Grandmother        Gallbladder cancer    Heart disease Sister    Migraines Sister    Breast cancer Neg Hx      ROS: Denies fever, fatigue, unexplained weight loss/gain, hearing or vision changes, cardiac or respiratory complaints. Denies neurological deficits, gastrointestinal or genitourinary complaints, mental health complaints, and skin changes.   Objective:   Today's Vitals   06/25/23 1314  BP: 116/78  Pulse: 74  Temp: 98.1 F (36.7 C)  TempSrc: Temporal  SpO2: 99%  Weight: 187 lb 6.4 oz (85 kg)  Height: 5\' 4"  (1.626 m)    GENERAL APPEARANCE: Well-appearing, in NAD. Well nourished.  SKIN: Pink, warm and dry. Turgor normal. No rash, lesion, ulceration, or ecchymoses. Hair evenly distributed.  HEENT: HEAD: Normocephalic.  EYES: PERRLA. EOMI. Lids intact w/o defect. Sclera white, Conjunctiva pink w/o exudate.  EARS: External ear w/o redness, swelling, masses or lesions. EAC clear. TM's intact, translucent w/o bulging, appropriate landmarks visualized. Appropriate acuity to conversational tones.  NOSE: Septum midline w/o deformity. Nares patent, mucosa pink and non-inflamed w/o drainage. No sinus tenderness.  THROAT: Uvula midline. Oropharynx clear. Tonsils non-inflamed w/o exudate. Oral mucosa pink and moist.  NECK: Supple, Trachea midline. Full ROM w/o pain or tenderness. No lymphadenopathy. Thyroid non-tender w/o enlargement or palpable masses.  BREASTS: Breasts pendulous, symmetrical, and w/o palpable masses. Nipples everted and w/o discharge. No rash or skin retraction. No axillary or supraclavicular lymphadenopathy.  RESPIRATORY: Chest wall  symmetrical w/o masses. Respirations even and non-labored. Breath sounds clear to auscultation bilaterally. No wheezes, rales, rhonchi, or crackles. CARDIAC: S1, S2 present, regular rate and rhythm. No gallops, murmurs, rubs, or clicks. No carotid bruits. Capillary refill <2 seconds. Peripheral pulses 2+ bilaterally. GI: Abdomen soft w/o distention. Normoactive bowel sounds. No palpable masses or tenderness. No guarding or rebound tenderness. Liver and spleen w/o tenderness or enlargement. No CVA tenderness.  MSK: Muscle tone and strength appropriate for age, w/o atrophy or abnormal movement.  EXTREMITIES: Active ROM intact, w/o tenderness, crepitus, or contracture. No obvious joint deformities or effusions. No clubbing, edema, or cyanosis.  NEUROLOGIC: CN's II-XII intact. Motor strength symmetrical with no obvious weakness. No sensory deficits.  Steady, even gait.  PSYCH/MENTAL STATUS: Alert, oriented x 3. Cooperative, appropriate mood and affect.    Assessment & Plan:  Encounter for general adult medical examination without abnormal findings -     CBC with Differential/Platelet -     Comprehensive metabolic panel -     TSH -     Lipid panel  Vitamin D deficiency -     VITAMIN D 25 Hydroxy (Vit-D Deficiency, Fractures)    Orders Placed This Encounter  Procedures   CBC with Differential/Platelet   Comprehensive metabolic panel   TSH   Lipid panel   VITAMIN D 25 Hydroxy (Vit-D Deficiency, Fractures)    PATIENT COUNSELING:  - Encourage a healthy well-balanced diet. Patient may adjust caloric intake to maintain or achieve ideal body weight. May reduce intake of dietary saturated fat and total fat and have adequate dietary potassium and calcium preferably from fresh fruits, vegetables, and low-fat dairy products.   -  importance of regular exercise   NEXT PREVENTATIVE PHYSICAL DUE IN 1 YEAR.  Return in about 6 months (around 12/23/2023) for thyroid disease, Vitamin D  deficiency.  Salvatore Decent, FNP

## 2023-06-27 ENCOUNTER — Encounter: Payer: Self-pay | Admitting: Internal Medicine

## 2023-06-27 NOTE — Telephone Encounter (Signed)
Can resubmit after 11/25

## 2023-07-15 ENCOUNTER — Other Ambulatory Visit: Payer: Self-pay | Admitting: Nurse Practitioner

## 2023-07-15 DIAGNOSIS — Z3041 Encounter for surveillance of contraceptive pills: Secondary | ICD-10-CM

## 2023-07-16 NOTE — Telephone Encounter (Signed)
Med refill request: Continuous BCPs Last AEX: 08/22/2022-TW Next AEX: recall sent per EMR, nothing scheduled yet. Last MMG (if hormonal med): 10/13/2022-neg birads 1 Refill authorized: rx pend.

## 2023-07-18 NOTE — Telephone Encounter (Signed)
Pt aware of approval and will be calling imaging to schedule.

## 2023-07-18 NOTE — Telephone Encounter (Signed)
Status Current Status: Approved Validity Period: 07/18/2023 - 08/17/2023 Authorization: [id number] (Cervical Spine MRI)

## 2023-08-03 ENCOUNTER — Encounter: Payer: Self-pay | Admitting: Physical Medicine and Rehabilitation

## 2023-08-06 ENCOUNTER — Inpatient Hospital Stay
Admission: RE | Admit: 2023-08-06 | Discharge: 2023-08-06 | Discharge status: Home / Self Care | Payer: Medicaid Other | Source: Ambulatory Visit | Attending: Physical Medicine and Rehabilitation | Admitting: Physical Medicine and Rehabilitation

## 2023-08-06 DIAGNOSIS — M542 Cervicalgia: Secondary | ICD-10-CM | POA: Diagnosis not present

## 2023-09-03 ENCOUNTER — Ambulatory Visit (INDEPENDENT_AMBULATORY_CARE_PROVIDER_SITE_OTHER): Payer: Medicaid Other | Admitting: Physical Medicine and Rehabilitation

## 2023-09-03 ENCOUNTER — Encounter: Payer: Self-pay | Admitting: Physical Medicine and Rehabilitation

## 2023-09-03 DIAGNOSIS — R202 Paresthesia of skin: Secondary | ICD-10-CM | POA: Diagnosis not present

## 2023-09-03 DIAGNOSIS — M25532 Pain in left wrist: Secondary | ICD-10-CM | POA: Diagnosis not present

## 2023-09-03 DIAGNOSIS — M25531 Pain in right wrist: Secondary | ICD-10-CM | POA: Diagnosis not present

## 2023-09-03 DIAGNOSIS — M542 Cervicalgia: Secondary | ICD-10-CM | POA: Diagnosis not present

## 2023-09-03 DIAGNOSIS — M7918 Myalgia, other site: Secondary | ICD-10-CM | POA: Diagnosis not present

## 2023-09-03 DIAGNOSIS — G4486 Cervicogenic headache: Secondary | ICD-10-CM

## 2023-09-03 NOTE — Progress Notes (Signed)
Cassandra Hughes - 53 y.o. female MRN [id number]  Date of birth: 1971-03-11  Office Visit Note: Visit Date: 09/03/2023 PCP: Salvatore Decent, FNP Referred by: Salvatore Decent, FNP  Subjective: Chief Complaint  Patient presents with   Neck - Pain   HPI: Cassandra Hughes is a 53 y.o. female who comes in today for evaluation of chronic, worsening and severe bilateral neck pain. Pain ongoing for several years. Her pain worsens with movement and activity. Her pain tends to be more severe in the mornings. Also reports worsening pain with cold weather. She reports history of chronic migraines, about 3-4 a month. She describes pain as sore and tight sensation, currently rates as 4 out of 10. Some relief of pain with home exercise regimen, rest and use of medications. She recently completed short course of formal physical therapy with minimal relief of pain. No history of dry needling treatments. Recent cervical MRI imaging exhibits uncovertebral and facet degenerative changes at the levels of C5-C6 and C6-C7. There is mild foraminal narrowing on the right at C5-C6 and bilaterally at C6-C7. No high grade spinal canal stenosis noted. Patient denies focal weakness. No recent trauma or falls.   Of note, she also reports bilateral hand pain and numbness. 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 States she was seen by headache specialist several years ago, states she underwent bilateral upper extremity NCV with EMG. I am unable to locate this study in EPIC. She is wearing bilateral wrist braces at nighttime. She is currently working in [employer].      Review of Systems  Musculoskeletal:  Positive for neck pain.  Neurological:  Positive for tingling. Negative for focal weakness and weakness.  All other systems reviewed and are negative.  Otherwise per HPI.  Assessment & Plan: Visit Diagnoses:    ICD-10-CM   1. Cervicalgia  M54.2 Ambulatory referral to Physical Medicine Rehab     2. Myofascial pain syndrome  M79.18 Ambulatory referral to Physical Medicine Rehab    3. Cervicogenic headache  G44.86 Ambulatory referral to Physical Medicine Rehab    4. Pain in left wrist  M25.532 Ambulatory referral to Orthopedic Surgery    5. Pain in right wrist  M25.531 Ambulatory referral to Orthopedic Surgery    6. Paresthesia of skin  R20.2 Ambulatory referral to Orthopedic Surgery       Plan: Findings:  1. Chronic, worsening and severe bilateral neck pain. Her pain is localized to bilateral occipital region and does radiate down to neck. No radicular symptoms down the arms. She continues to have severe pain despite good conservative therapies such as formal physical therapy, home exercise regimen, rest and use of medications. Patients clinical presentation and exam are consistent with facet mediated pain. She does have pain with side to side rotation. There are uncovertebral and facet degenerative changes at the levels of C5-C6 and C6-C7. We discussed treatment plan in detail today. Next step is to perform diagnostic and hopefully therapeutic bilateral C5-C6 and C6-C7 facet joint injections under fluoroscopic guidance. If good relief of pain with injections we can repeat this procedure infrequently as needed. I discussed injection procedure with her in detail today, she has no questions at this time. Should her symptoms persist would like her to re-group with PT for dry needling. No red flag symptoms noted on exam today.   2. Chronic, worsening and severe pain/paresthesias to bilateral wrists and hands. She continues to have severe symptoms that are most consistent with carpal tunnel  syndrome. I feel she would benefit from referral to our hand surgeon Dr. Denese Killings for evaluation and treatment. We are happy to perform bilateral upper extremity nerve studies if he feels appropriate. I will go ahead and place referral today. I informed her she will get a call from his office to schedule.      Meds & Orders: No orders of the defined types were placed in this encounter.   Orders Placed This Encounter  Procedures   Ambulatory referral to Orthopedic Surgery   Ambulatory referral to Physical Medicine Rehab    Follow-up: Return for Bilateral C5-C6 and C6-C7 facet joint injections.   Procedures: No procedures performed      Clinical History: Narrative & Impression CLINICAL DATA:  Cervicalgia.  Neck pain, chronic.   EXAM: MRI CERVICAL SPINE WITHOUT CONTRAST   TECHNIQUE: Multiplanar, multisequence MR imaging of the cervical spine was performed. No intravenous contrast was administered.   COMPARISON:  Radiographs March 12, 2023.   FINDINGS: Alignment: Straightening of the cervical curvature with trace anterolisthesis of C4 over C5.   Vertebrae: No fracture, evidence of discitis, or bone lesion.   Cord: Normal signal and morphology.   Posterior Fossa, vertebral arteries, paraspinal tissues: Negative.   Disc levels:   C2-3: No spinal canal or neural foraminal stenosis.   C3-4: No spinal canal or neural foraminal stenosis.   C4-5: No spinal canal or neural foraminal stenosis.   C5-6: Small posterior disc osteophyte complex without significant spinal canal stenosis. Uncovertebral and facet degenerative changes resulting in mild right neural foraminal narrowing.   C6-7: Small posterior disc osteophyte complex without significant spinal canal stenosis. Uncovertebral and facet degenerative changes resulting in mild bilateral neural foraminal narrowing.   C7-T1: No spinal canal or neural foraminal stenosis.   IMPRESSION: 1. Mild degenerative changes of the cervical spine with mild neural foraminal narrowing on the right at C5-6 and bilaterally at C6-7. 2. No high-grade spinal canal stenosis at any level.     Electronically Signed   By: Baldemar Lenis M.D.   On: 08/22/2023 10:02   She reports that she has quit smoking. Her smoking use  included cigarettes. She has never used smokeless tobacco. No results for input(s): "HGBA1C", "LABURIC" in the last 8760 hours.  Objective:  VS:  HT:    WT:   BMI:     BP:   HR: bpm  TEMP: ( )  RESP:  Physical Exam Vitals and nursing note reviewed.  HENT:     Head: Normocephalic and atraumatic.     Right Ear: External ear normal.     Left Ear: External ear normal.     Nose: Nose normal.     Mouth/Throat:     Mouth: Mucous membranes are moist.  Eyes:     Extraocular Movements: Extraocular movements intact.  Cardiovascular:     Rate and Rhythm: Normal rate.     Pulses: Normal pulses.  Pulmonary:     Effort: Pulmonary effort is normal.  Abdominal:     General: Abdomen is flat. There is no distension.  Musculoskeletal:        General: Tenderness present.     Cervical back: Tenderness present.     Comments: Pain noted with side to side rotation. Patient has good strength in the upper extremities including 5 out of 5 strength in wrist extension, long finger flexion and APB. Shoulder range of motion is full bilaterally without any sign of impingement. There is no atrophy of the  hands intrinsically. Sensation intact bilaterally. Myofascial tightness noted to bilateral levator scapulae and sternocleidomastoid regions. Negative Hoffman's sign. Negative Spurling's sign. Positive Phalen's on the right.         Skin:    General: Skin is warm and dry.     Capillary Refill: Capillary refill takes less than 2 seconds.  Neurological:     General: No focal deficit present.     Mental Status: She is alert and oriented to person, place, and time.  Psychiatric:        Mood and Affect: Mood normal.        Behavior: Behavior normal.     Ortho Exam  Imaging: No results found.  Past Medical/Family/Surgical/Social History: Medications & Allergies reviewed per EMR, new medications updated. Patient Active Problem List   Diagnosis Date Noted   Migraine without aura and with status  migrainosus, not intractable 03/08/2023   Vitamin D deficiency 03/08/2023   Hyperlipidemia 03/08/2023   Acquired hypothyroidism 03/08/2023   Intramural leiomyoma of uterus 02/12/2017   DUB (dysfunctional uterine bleeding) 02/12/2017   Family history of cancer of gallbladder 10/15/2015   Past Medical History:  Diagnosis Date   Migraines    Thyroid disease    Family History  Problem Relation Age of Onset   Cancer Mother        gallbladder    Cancer Father        prostate   Cancer Maternal Grandmother        Gallbladder cancer    Heart disease Sister    Migraines Sister    Breast cancer Neg Hx    Past Surgical History:  Procedure Laterality Date   CESAREAN SECTION     CHOLECYSTECTOMY, LAPAROSCOPIC  01/2016   CYST REMOVAL NECK     PELVIC LAPAROSCOPY     fibroid    SKIN CANCER EXCISION N/A    TONSILLECTOMY AND ADENOIDECTOMY     Social History   Occupational History   Not on file  Tobacco Use   Smoking status: Former    Types: Cigarettes   Smokeless tobacco: Never  Vaping Use   Vaping status: Never Used  Substance and Sexual Activity   Alcohol use: Yes    Comment: rarely   Drug use: No   Sexual activity: Not Currently    Birth control/protection: OCP    Comment: First IC >16 y/o, 5-7 Partners, Hx of +TV

## 2023-09-07 ENCOUNTER — Telehealth: Payer: Self-pay | Admitting: Physical Medicine and Rehabilitation

## 2023-09-07 NOTE — Telephone Encounter (Signed)
Patient returned call asked for a call back. [phone]

## 2023-09-16 ENCOUNTER — Other Ambulatory Visit: Payer: Self-pay | Admitting: Internal Medicine

## 2023-09-16 ENCOUNTER — Other Ambulatory Visit: Payer: Self-pay | Admitting: Nurse Practitioner

## 2023-09-16 DIAGNOSIS — G43001 Migraine without aura, not intractable, with status migrainosus: Secondary | ICD-10-CM

## 2023-09-16 DIAGNOSIS — Z3041 Encounter for surveillance of contraceptive pills: Secondary | ICD-10-CM

## 2023-09-17 NOTE — Telephone Encounter (Signed)
Med refill request: Continuous BCPs Last AEX: 08/22/2022-TW Next AEX: 11/12/23 Last MMG (if hormonal med): 10/13/2022-neg birads 1 Refill authorized: #[id number] 0 RF

## 2023-09-27 ENCOUNTER — Encounter: Payer: Medicaid Other | Admitting: Physical Medicine and Rehabilitation

## 2023-10-01 ENCOUNTER — Other Ambulatory Visit (INDEPENDENT_AMBULATORY_CARE_PROVIDER_SITE_OTHER): Payer: Self-pay

## 2023-10-01 ENCOUNTER — Ambulatory Visit (INDEPENDENT_AMBULATORY_CARE_PROVIDER_SITE_OTHER): Payer: Medicaid Other | Admitting: Orthopedic Surgery

## 2023-10-01 DIAGNOSIS — M25532 Pain in left wrist: Secondary | ICD-10-CM

## 2023-10-01 DIAGNOSIS — M25531 Pain in right wrist: Secondary | ICD-10-CM

## 2023-10-01 DIAGNOSIS — G5603 Carpal tunnel syndrome, bilateral upper limbs: Secondary | ICD-10-CM | POA: Diagnosis not present

## 2023-10-01 NOTE — Progress Notes (Signed)
 [name] - 53 y.o. female MRN [id number]  Date of birth: [birth year]  Office Visit Note: Visit Date: 10/01/2023 PCP: Salvatore Decent, FNP Referred by: Juanda Chance, NP  Subjective: No chief complaint on file.  HPI: Cassandra Hughes is a pleasant 53 y.o. female who presents today for evaluation of hand numbness and tingling particularly of the ulnar aspect of the long finger and radial aspect of the ring finger bilaterally.  She states the symptoms have been present for multiple years, worsening in nature.  She does have a history of cervical degenerative changes at the levels of C5-C6 and C6-C7.  She is seeing Dr. Alvester Morin, and has a plan for facet joint injections to the bilateral C5-C6 and C6-C7 region to be performed tomorrow.  As for the wrist, she has utilized night bracing with mild relief of her nocturnal symptoms.  No other significant treat modalities have been tried.  She states that she did undergo electrodiagnostic testing years prior, however she is unsure of what the findings were.  She has been sent to me by my partners for specific hand surgical evaluation and discussion.  Pertinent ROS were reviewed with the patient and found to be negative unless otherwise specified above in HPI.   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 Bilateral carpal tunnel syndrome   2. Bilateral wrist pain     Plan: Extensive discussion was had with the patient today regarding her bilateral hand numbness and tingling.  Clinically, she is demonstrating signs symptoms consistent with bilateral carpal tunnel syndrome as she meets the CTS-6 criteria bilaterally  for high likelihood of diagnosis.  There is however likely an element of double crush syndrome in her case given her cervical history.  I am curious to see what type of relief that she obtains from these upcoming injections of the cervical spine tomorrow.  For this reason, I would like her to continue with this plan for the cervical spine for both diagnostic and therapeutic measures.  I have encouraged her to return to me in approximately 6 weeks time for repeat clinical check, at that time we could further discuss next steps for her bilateral carpal tunnel syndrome.  We discussed the possibility of injections for symptom relief, once again for diagnostic and therapeutic measures as well as carpal tunnel release from a surgical standpoint and all risks and benefits.  She does work in [employer] at an [employer], she feels that it may be prudent for her to have her surgery done in the summertime from a work standpoint given that she has to lift heavy things and does an excessive amount of work with her hands during the day is currently.  Return in 6 weeks for recheck after cervical spine injections.  Follow-up: No follow-ups on file.   Meds & Orders: No orders of the defined types were placed in this encounter.   Orders Placed This Encounter  Procedures   XR Wrist Complete Right   XR Wrist Complete Left     Procedures: No procedures performed      Clinical History: Narrative & Impression CLINICAL DATA:  Cervicalgia.  Neck pain, chronic.   EXAM: MRI CERVICAL SPINE WITHOUT CONTRAST   TECHNIQUE: Multiplanar, multisequence MR imaging  of the cervical spine was performed. No intravenous contrast was administered.   COMPARISON:  Radiographs March 12, 2023.   FINDINGS: Alignment: Straightening of the cervical curvature with trace anterolisthesis of C4 over C5.   Vertebrae: No fracture, evidence of discitis, or bone lesion.   Cord: Normal signal and morphology.   Posterior Fossa,  vertebral arteries, paraspinal tissues: Negative.   Disc levels:   C2-3: No spinal canal or neural foraminal stenosis.   C3-4: No spinal canal or neural foraminal stenosis.   C4-5: No spinal canal or neural foraminal stenosis.   C5-6: Small posterior disc osteophyte complex without significant spinal canal stenosis. Uncovertebral and facet degenerative changes resulting in mild right neural foraminal narrowing.   C6-7: Small posterior disc osteophyte complex without significant spinal canal stenosis. Uncovertebral and facet degenerative changes resulting in mild bilateral neural foraminal narrowing.   C7-T1: No spinal canal or neural foraminal stenosis.   IMPRESSION: 1. Mild degenerative changes of the cervical spine with mild neural foraminal narrowing on the right at C5-6 and bilaterally at C6-7. 2. No high-grade spinal canal stenosis at any level.     Electronically Signed   By: Baldemar Lenis M.D.   On: 08/22/2023 10:02  She reports that she has quit smoking. Her smoking use included cigarettes. She has never used smokeless tobacco. No results for input(s): "HGBA1C", "LABURIC" in the last 8760 hours.  Objective:   Vital Signs: There were no vitals taken for this visit.  Physical Exam  Gen: Well-appearing, in no acute distress; non-toxic CV: Regular Rate. Well-perfused. Warm.  Resp: Breathing unlabored on room air; no wheezing. Psych: Fluid speech in conversation; appropriate affect; normal thought process  Ortho Exam PHYSICAL EXAM:  General: Patient is well appearing and in no distress. Cervical spine mobility is full in all directions:  Skin and Muscle: No significant skin changes are apparent to upper extremities.   Range of Motion and Palpation Tests: Mobility is full about the elbows with flexion and extension.  Forearm supination and pronation are 85/85 bilaterally.  Wrist flexion/extension is 75/65 bilaterally.  Digital flexion and extension  are full.  Thumb opposition is full to the base of the small fingers bilaterally.    No cords or nodules are palpated.  No triggering is observed.     Neurologic, Vascular, Motor: Sensation is diminished to light touch in the bilateral median distribution.    Thenar atrophy: Mild right, negative left Tinel sign: Positive bilateral carpal tunnel Carpal tunnel compression: Positive bilateral Phalen test: Positive bilateral  Sensory bilateral hand 2-point discrimination (thumb, index, middle): 10 mm right, 8 mm left  Motor bilateral hand APB: 5/5 Able to perform thumb opposition  Fingers pink and well perfused.  Capillary refill is brisk.     No results found for: "HGBA1C"   Imaging: XR Wrist Complete Left Result Date: 10/01/2023 X-rays of the left wrist, multiple views were performed today X-rays demonstrate stable appearance of the radiocarpal and midcarpal articulations without significant bony abnormalities.  XR Wrist Complete Right Result Date: 10/01/2023 X-rays of the right wrist, multiple views were performed today X-rays demonstrate stable appearance of the radiocarpal and midcarpal articulations without significant bony abnormalities.   Past Medical/Family/Surgical/Social History: Medications & Allergies reviewed per EMR, new medications updated. Patient Active Problem List   Diagnosis Date Noted   Migraine without aura and with status migrainosus, not intractable 03/08/2023   Vitamin D deficiency 03/08/2023   Hyperlipidemia 03/08/2023   Acquired hypothyroidism 03/08/2023   Intramural  leiomyoma of uterus 02/12/2017   DUB (dysfunctional uterine bleeding) 02/12/2017   Family history of cancer of gallbladder 10/15/2015   Past Medical History:  Diagnosis Date   Migraines    Thyroid disease    Family History  Problem Relation Age of Onset   Cancer Mother        gallbladder    Cancer Father        prostate   Cancer Maternal Grandmother        Gallbladder  cancer    Heart disease Sister    Migraines Sister    Breast cancer Neg Hx    Past Surgical History:  Procedure Laterality Date   CESAREAN SECTION     CHOLECYSTECTOMY, LAPAROSCOPIC  01/2016   CYST REMOVAL NECK     PELVIC LAPAROSCOPY     fibroid    SKIN CANCER EXCISION N/A    TONSILLECTOMY AND ADENOIDECTOMY     Social History   Occupational History   Not on file  Tobacco Use   Smoking status: Former    Types: Cigarettes   Smokeless tobacco: Never  Vaping Use   Vaping status: Never Used  Substance and Sexual Activity   Alcohol use: Yes    Comment: rarely   Drug use: No   Sexual activity: Not Currently    Birth control/protection: OCP    Comment: First IC >16 y/o, 5-7 [employer], Hx of +TV    Tamber Burtch Fara Boros) Shaconda Hajduk, M.D. [facility], Hand Surgery

## 2023-10-02 ENCOUNTER — Other Ambulatory Visit: Payer: Self-pay

## 2023-10-02 ENCOUNTER — Ambulatory Visit (INDEPENDENT_AMBULATORY_CARE_PROVIDER_SITE_OTHER): Payer: Medicaid Other | Admitting: Physical Medicine and Rehabilitation

## 2023-10-02 VITALS — BP 138/93 | HR 91

## 2023-10-02 DIAGNOSIS — M47812 Spondylosis without myelopathy or radiculopathy, cervical region: Secondary | ICD-10-CM | POA: Diagnosis not present

## 2023-10-02 MED ORDER — METHYLPREDNISOLONE ACETATE 40 MG/ML IJ SUSP
40.0000 mg | Freq: Once | INTRAMUSCULAR | Status: AC
  Administered 2023-10-02: 40 mg

## 2023-10-02 NOTE — Patient Instructions (Signed)

## 2023-10-02 NOTE — Progress Notes (Signed)
 Pain score-----6 No thinners No allergies to contrast dyes

## 2023-10-09 NOTE — Progress Notes (Signed)
 [name] - 53 y.o. female MRN [id number]  Date of birth: 04/20/1971  Office Visit Note: Visit Date: 10/02/2023 PCP: Salvatore Decent, FNP Referred by: Salvatore Decent, FNP  Subjective: Chief Complaint  Patient presents with   Neck - Pain   HPI:  Cassandra Hughes is a 53 y.o. female who comes in today at the request of Ellin Goodie, FNP for planned Bilateral  C5-6 and C6-7 Cervical facet/medial branch block with fluoroscopic guidance.  The patient has failed conservative care including home exercise, medications, time and activity modification.  This injection will be diagnostic and hopefully therapeutic.  Please see requesting physician notes for further details and justification.  Exam has shown concordant pain with facet joint loading.   ROS Otherwise per HPI.  Assessment & Plan: Visit Diagnoses:    ICD-10-CM   1. Cervical spondylosis without myelopathy  M47.812 XR C-ARM NO REPORT    Facet Injection    methylPREDNISolone acetate (DEPO-MEDROL) injection 40 mg      Plan: No additional findings.   Meds & Orders:  Meds ordered this encounter  Medications   methylPREDNISolone acetate (DEPO-MEDROL) injection 40 mg    Orders Placed This Encounter  Procedures   Facet Injection   XR C-ARM NO REPORT    Follow-up: Return if symptoms worsen or fail to improve.   Procedures: No procedures performed  Cervical Facet Joint Intra-Articular Injection with Fluoroscopic Guidance  Patient: Cassandra Hughes      Date of Birth: 01/18/1971 MRN: [id number] PCP: Salvatore Decent, FNP      Visit Date: 10/02/2023   Universal Protocol:    Date/Time: 10/08/2509:48 AM  Consent Given By: the patient  Position: PRONE  Additional Comments: Vital signs were monitored before and after the procedure. Patient was prepped and draped in the usual sterile fashion. The correct patient, procedure, and site was verified.   Injection Procedure Details:  Procedure Site One Meds Administered:   Meds ordered this encounter  Medications   methylPREDNISolone acetate (DEPO-MEDROL) injection 40 mg     Laterality: Bilateral  Location/Site:  C5-6 C6-7  Needle size: 25 G  Needle type: Spinal  Needle Placement: Articular  Findings:  -Contrast Used: 0.5 mL iohexol 180 mg iodine/mL   -Comments: Excellent flow of contrast producing a partial arthrogram.  Procedure Details: The region overlying the facet joints mentioned above were localized under fluoroscopic visualization. The needle was inserted down to the level of the lateral mass of the superior articular process of the facet joint to be injected. Then, the needle was "walked off" inferiorly into the lateral aspect of the facet joint. Bi-planar images were used for confirming placement and spot radiographs were documented.  A 0.25 ml volume of Omnipaque-240 was injected into the facet joint and a standard partial arthrogram was obtained. Radiographs were obtained of the arthrogram. A 0.5 ml. volume of the steroid/anesthetic solution was injected into the joint. This procedure was repeated for each facet joint injected.   Additional Comments:  No complications occurred Dressing: Band-Aid    Post-procedure details: Patient was observed during the procedure. Post-procedure instructions were reviewed.  Patient left the clinic in stable condition.   Clinical History: Narrative & Impression CLINICAL DATA:  Cervicalgia.  Neck pain, chronic.   EXAM: MRI CERVICAL SPINE WITHOUT CONTRAST   TECHNIQUE: Multiplanar, multisequence MR imaging of the cervical spine was performed. No intravenous contrast was administered.   COMPARISON:  Radiographs March 12, 2023.   FINDINGS: Alignment: Straightening of the cervical curvature with trace anterolisthesis  of C4 over C5.   Vertebrae: No fracture, evidence of discitis, or bone lesion.   Cord: Normal signal and morphology.   Posterior Fossa, vertebral arteries, paraspinal  tissues: Negative.   Disc levels:   C2-3: No spinal canal or neural foraminal stenosis.   C3-4: No spinal canal or neural foraminal stenosis.   C4-5: No spinal canal or neural foraminal stenosis.   C5-6: Small posterior disc osteophyte complex without significant spinal canal stenosis. Uncovertebral and facet degenerative changes resulting in mild right neural foraminal narrowing.   C6-7: Small posterior disc osteophyte complex without significant spinal canal stenosis. Uncovertebral and facet degenerative changes resulting in mild bilateral neural foraminal narrowing.   C7-T1: No spinal canal or neural foraminal stenosis.   IMPRESSION: 1. Mild degenerative changes of the cervical spine with mild neural foraminal narrowing on the right at C5-6 and bilaterally at C6-7. 2. No high-grade spinal canal stenosis at any level.     Electronically Signed   By: Baldemar Lenis M.D.   On: 08/22/2023 10:02     Objective:  VS:  HT:    WT:   BMI:     BP:(!) 138/93  HR:91bpm  TEMP: ( )  RESP:  Physical Exam Vitals and nursing note reviewed.  Constitutional:      General: She is not in acute distress.    Appearance: Normal appearance. She is not ill-appearing.  HENT:     Head: Normocephalic and atraumatic.     Right Ear: External ear normal.     Left Ear: External ear normal.  Eyes:     Extraocular Movements: Extraocular movements intact.  Cardiovascular:     Rate and Rhythm: Normal rate.     Pulses: Normal pulses.  Musculoskeletal:     Cervical back: Tenderness present. No rigidity.     Right lower leg: No edema.     Left lower leg: No edema.     Comments: Patient has good strength in the upper extremities including 5 out of 5 strength in wrist extension long finger flexion and APB.  There is no atrophy of the hands intrinsically.  There is a negative Hoffmann's test.   Lymphadenopathy:     Cervical: No cervical adenopathy.  Skin:    Findings: No erythema,  lesion or rash.  Neurological:     General: No focal deficit present.     Mental Status: She is alert and oriented to person, place, and time.     Sensory: No sensory deficit.     Motor: No weakness or abnormal muscle tone.     Coordination: Coordination normal.  Psychiatric:        Mood and Affect: Mood normal.        Behavior: Behavior normal.      Imaging: No results found.

## 2023-10-09 NOTE — Procedures (Signed)
 Cervical Facet Joint Intra-Articular Injection with Fluoroscopic Guidance  Patient: Cassandra Hughes      Date of Birth: 09-Aug-1971 MRN: [id number] PCP: Salvatore Decent, FNP      Visit Date: 10/02/2023   Universal Protocol:    Date/Time: 10/08/2509:48 AM  Consent Given By: the patient  Position: PRONE  Additional Comments: Vital signs were monitored before and after the procedure. Patient was prepped and draped in the usual sterile fashion. The correct patient, procedure, and site was verified.   Injection Procedure Details:  Procedure Site One Meds Administered:  Meds ordered this encounter  Medications   methylPREDNISolone acetate (DEPO-MEDROL) injection 40 mg     Laterality: Bilateral  Location/Site:  C5-6 C6-7  Needle size: 25 G  Needle type: Spinal  Needle Placement: Articular  Findings:  -Contrast Used: 0.5 mL iohexol 180 mg iodine/mL   -Comments: Excellent flow of contrast producing a partial arthrogram.  Procedure Details: The region overlying the facet joints mentioned above were localized under fluoroscopic visualization. The needle was inserted down to the level of the lateral mass of the superior articular process of the facet joint to be injected. Then, the needle was "walked off" inferiorly into the lateral aspect of the facet joint. Bi-planar images were used for confirming placement and spot radiographs were documented.  A 0.25 ml volume of Omnipaque-240 was injected into the facet joint and a standard partial arthrogram was obtained. Radiographs were obtained of the arthrogram. A 0.5 ml. volume of the steroid/anesthetic solution was injected into the joint. This procedure was repeated for each facet joint injected.   Additional Comments:  No complications occurred Dressing: Band-Aid    Post-procedure details: Patient was observed during the procedure. Post-procedure instructions were reviewed.  Patient left the clinic in stable condition.

## 2023-11-05 ENCOUNTER — Ambulatory Visit (INDEPENDENT_AMBULATORY_CARE_PROVIDER_SITE_OTHER): Payer: Medicaid Other | Admitting: Orthopedic Surgery

## 2023-11-05 DIAGNOSIS — G5603 Carpal tunnel syndrome, bilateral upper limbs: Secondary | ICD-10-CM | POA: Diagnosis not present

## 2023-11-05 NOTE — Progress Notes (Unsigned)
Bilateral carpal tunnel syndrome

## 2023-11-12 ENCOUNTER — Ambulatory Visit (INDEPENDENT_AMBULATORY_CARE_PROVIDER_SITE_OTHER): Payer: Medicaid Other | Admitting: Nurse Practitioner

## 2023-11-12 ENCOUNTER — Encounter: Payer: Self-pay | Admitting: Nurse Practitioner

## 2023-11-12 VITALS — BP 118/82 | HR 82 | Ht 63.75 in | Wt 192.0 lb

## 2023-11-12 DIAGNOSIS — Z3041 Encounter for surveillance of contraceptive pills: Secondary | ICD-10-CM | POA: Diagnosis not present

## 2023-11-12 DIAGNOSIS — Z01419 Encounter for gynecological examination (general) (routine) without abnormal findings: Secondary | ICD-10-CM | POA: Diagnosis not present

## 2023-11-12 MED ORDER — LEVONORGESTREL-ETHINYL ESTRAD 0.1-20 MG-MCG PO TABS: ORAL_TABLET | ORAL | 3 refills | Status: DC

## 2023-11-12 NOTE — Progress Notes (Signed)
 Cassandra Hughes Area [birth year] [id number]   History:  53 y.o. G1P1 presents for annual exam. Amenorrheic on continuous OCPs. She is wondering if she is menopausal. FSH 11 one year ago. Denies menopausal symptoms. LEEP about 20 years ago per patient, subsequent paps normal. Hypothyroidism managed by endocrinology.   Gynecologic History No LMP recorded. (Menstrual status: Oral contraceptives).   Contraception: abstinence and OCP (estrogen/progesterone) Sexually active: No  Health Maintenance Last Pap: 08/22/2022. Results were: Normal neg HPV Last mammogram: 10/13/2022. Results were: Normal Last colonoscopy: 02/21/2021. Results were: Normal, 10-year recall Last Dexa: Not indicated  Past medical history, past surgical history, family history and social history were all reviewed and documented in the EPIC chart. Single. Working in [employer]. 53 yo son, 7th grade at [employer], into video games, does run club.   ROS:  A ROS was performed and pertinent positives and negatives are included.  Exam:  Vitals:   11/12/23 1424  BP: 118/82  Pulse: 82  SpO2: 99%  Weight: 192 lb (87.1 kg)  Height: 5' 3.75" (1.619 m)      Body mass index is 33.22 kg/m.  General appearance:  Normal Thyroid:  Symmetrical, normal in size, without palpable masses or nodularity. Respiratory  Auscultation:  Clear without wheezing or rhonchi Cardiovascular  Auscultation:  Regular rate, without rubs, murmurs or gallops  Edema/varicosities:  Not grossly evident Abdominal  Soft,nontender, without masses, guarding or rebound.  Liver/spleen:  No organomegaly noted  Hernia:  None appreciated  Skin  Inspection:  Grossly normal Breasts: Examined lying and sitting.   Right: Without masses, retractions, nipple discharge or axillary adenopathy.   Left: Without masses, retractions, nipple discharge or axillary adenopathy. Pelvic: External genitalia:  no lesions              Urethra:  normal  appearing urethra with no masses, tenderness or lesions              Bartholins and Skenes: normal                 Vagina: normal appearing vagina with normal color and discharge, no lesions              Cervix: no lesions Bimanual Exam:  Uterus:  no masses or tenderness              Adnexa: no mass, fullness, tenderness              Rectovaginal: Deferred              Anus:  normal, no lesions  Patient informed chaperone available to be present for breast and pelvic exam. Patient has requested no chaperone to be present. Patient has been advised what will be completed during breast and pelvic exam.   Assessment/Plan:  53 y.o. G1P1 for annual exam.   Well female exam with routine gynecological exam - Education provided on SBEs, importance of preventative screenings, current guidelines, high calcium diet, regular exercise, and multivitamin daily. Labs with PCP.   Encounter for surveillance of contraceptive pills - Plan: levonorgestrel-ethinyl estradiol (LESSINA-28) 0.1-20 MG-MCG tablet daily. Takes continuously, amenorrheic. Will discontinue to determine menopause status. If bleeding returns she will restart COCs.   Screening for cervical cancer - LEEP > 20 years ago, normal paps since.  Will repeat at 5-year interval per guidelines.   Screening for breast cancer - Normal mammogram history.  Continue annual screenings.  Normal breast exam today.  Screening for colon cancer - 2022 colonoscopy. Will  repeat at GI's recommended interval.   Return in about 1 year (around 11/11/2024) for Annual.      Cassandra Hughes [facility], 2:48 PM 11/12/2023

## 2023-11-27 ENCOUNTER — Ambulatory Visit: Admitting: Physical Medicine and Rehabilitation

## 2023-11-27 ENCOUNTER — Encounter: Payer: Self-pay | Admitting: Physical Medicine and Rehabilitation

## 2023-11-27 DIAGNOSIS — R202 Paresthesia of skin: Secondary | ICD-10-CM

## 2023-11-27 DIAGNOSIS — M7918 Myalgia, other site: Secondary | ICD-10-CM

## 2023-11-27 DIAGNOSIS — M25531 Pain in right wrist: Secondary | ICD-10-CM

## 2023-11-27 DIAGNOSIS — M25532 Pain in left wrist: Secondary | ICD-10-CM

## 2023-11-27 DIAGNOSIS — M47812 Spondylosis without myelopathy or radiculopathy, cervical region: Secondary | ICD-10-CM

## 2023-11-27 NOTE — Progress Notes (Signed)
 Pain Scale   Average Pain 0 Patient advising her pain comes and goes bilateral hands. Patient states she has numbness and tingling, occ. weakness bilaterally. Patient advising she also sleeps with braces on both hands at night due to pain. Patient advising she is Right hand dominate.        +Driver, -BT, -Dye Allergies.

## 2023-11-27 NOTE — Progress Notes (Signed)
 Cassandra Hughes - 53 y.o. female MRN [id number]  Date of birth: 1970/12/14  Office Visit Note: Visit Date: 11/27/2023 PCP: Gavin Kast, FNP Referred by: Merrill Abide, MD  Subjective: Chief Complaint  Patient presents with   Right Hand - Numbness   Left Hand - Numbness   HPI: Cassandra Hughes is a 53 y.o. female who comes in today at the request of Dr. Anshul Agarwala for evaluation and management of chronic, worsening and severe pain, numbness and tingling in the Bilateral upper extremities.  Patient is Right hand dominant. Please see our prior notes for details and justification.  She is basically having chronic worsening severe bilateral hand pain with numbness and tingling is intermittent but quite severe at times.  She has been wearing wrist splints at night.  She has had some neck issues that can be reviewed in our prior notes as well and were looking at facet injections for that but no high-grade stenosis or nerve compression of the cervical spine.       ROS Otherwise per HPI.  Assessment & Plan: Visit Diagnoses:    ICD-10-CM   1. Paresthesia of skin  R20.2 NCV with EMG (electromyography)    2. Pain in left wrist  M25.532     3. Pain in right wrist  M25.531     4. Myofascial pain syndrome  M79.18     5. Cervical spondylosis without myelopathy  M47.812        Plan: Impression: The above electrodiagnostic study is ABNORMAL and reveals evidence of:  a severe right median nerve entrapment at the wrist (carpal tunnel syndrome) affecting sensory and motor components.   a moderate to severe left median nerve entrapment at the wrist (carpal tunnel syndrome) affecting sensory and motor components.   There is no significant electrodiagnostic evidence of any other focal nerve entrapment, brachial plexopathy or cervical radiculopathy.   Recommendations: 1.  Follow-up with referring physician. 2.  Continue current management of symptoms. 3.  Suggest surgical  evaluation.  Meds & Orders: No orders of the defined types were placed in this encounter.   Orders Placed This Encounter  Procedures   NCV with EMG (electromyography)    Follow-up: Return for Anshul Agarwala, MD.   Procedures: No procedures performed  EMG & NCV Findings: Evaluation of the left median motor nerve showed prolonged distal onset latency (4.8 ms) and decreased conduction velocity (Elbow-Wrist, 49 m/s).  The right median motor nerve showed prolonged distal onset latency (8.7 ms), reduced amplitude (1.2 mV), and decreased conduction velocity (Elbow-Wrist, 40 m/s).  The left radial motor nerve showed prolonged distal onset latency (3.1 ms).  The left median (across palm) sensory and the right median (across palm) sensory nerves showed prolonged distal peak latency (Wrist, L7.2, R5.2 ms), reduced amplitude (L2.2, R6.1 V), and prolonged distal peak latency (Palm, L4.8, R3.5 ms).  All remaining nerves (as indicated in the following tables) were within normal limits.  Left vs. Right side comparison data for the median motor nerve indicates abnormal L-R latency difference (3.9 ms) and abnormal L-R amplitude difference (78.9 %).  All remaining left vs. right side differences were within normal limits.    Needle evaluation of the right abductor pollicis brevis muscle showed increased insertional activity and increased spontaneous activity.  All remaining muscles (as indicated in the following table) showed no evidence of electrical instability.    Impression: The above electrodiagnostic study is ABNORMAL and reveals evidence of:  a severe right median nerve entrapment at the  wrist (carpal tunnel syndrome) affecting sensory and motor components.   a moderate to severe left median nerve entrapment at the wrist (carpal tunnel syndrome) affecting sensory and motor components.   There is no significant electrodiagnostic evidence of any other focal nerve entrapment, brachial plexopathy or  cervical radiculopathy.   Recommendations: 1.  Follow-up with referring physician. 2.  Continue current management of symptoms. 3.  Suggest surgical evaluation.  ___________________________ Collin Deal FAAPMR Board Certified, American Board of Physical Medicine and Rehabilitation    Nerve Conduction Studies Anti Sensory Summary Table   Stim Site NR Peak (ms) Norm Peak (ms) P-T Amp (V) Norm P-T Amp Site1 Site2 Delta-P (ms) Dist (cm) Vel (m/s) Norm Vel (m/s)  Left Median Acr Palm Anti Sensory (2nd Digit)  30.5C  Wrist    *7.2 <3.6 *2.2 >10 Wrist Palm 2.4 0.0    Palm    *4.8 <2.0 8.5         Right Median Acr Palm Anti Sensory (2nd Digit)  30.2C  Wrist    *5.2 <3.6 *6.1 >10 Wrist Palm 1.7 0.0    Palm    *3.5 <2.0 12.9         Left Radial Anti Sensory (Base 1st Digit)  30.3C  Wrist    2.2 <3.1 24.4  Wrist Base 1st Digit 2.2 0.0    Right Radial Anti Sensory (Base 1st Digit)  30.1C  Wrist    2.3 <3.1 16.7  Wrist Base 1st Digit 2.3 0.0    Left Ulnar Anti Sensory (5th Digit)  30.8C  Wrist    3.5 <3.7 23.4 >15.0 Wrist 5th Digit 3.5 14.0 40 >38  Right Ulnar Anti Sensory (5th Digit)  30.6C  Wrist    3.4 <3.7 25.5 >15.0 Wrist 5th Digit 3.4 14.0 41 >38   Motor Summary Table   Stim Site NR Onset (ms) Norm Onset (ms) O-P Amp (mV) Norm O-P Amp Site1 Site2 Delta-0 (ms) Dist (cm) Vel (m/s) Norm Vel (m/s)  Left Median Motor (Abd Poll Brev)  30.8C  Wrist    *4.8 <4.2 5.7 >5 Elbow Wrist 3.9 19.0 *49 >50  Elbow    8.7  5.3         Right Median Motor (Abd Poll Brev)  30.6C  Wrist    *8.7 <4.2 *1.2 >5 Elbow Wrist 4.7 19.0 *40 >50  Elbow    13.4  1.0         Left Radial Motor (Ext Indicis)  31C  8cm    *3.1 <2.5 8.4 >1.7 Up Arm 8cm 2.8 19.0 68 >60  Up Arm    5.9  8.3  Axilla Up Arm 1.2 10.0 83   Axilla    7.1  8.3         Right Ulnar Motor (Abd Dig Min)  30.8C  Wrist    3.0 <4.2 7.9 >3 B Elbow Wrist 3.1 20.0 65 >53  B Elbow    6.1  8.1  A Elbow B Elbow 1.5 10.0 67 >53  A Elbow    7.6   7.7          EMG   Side Muscle Nerve Root Ins Act Fibs Psw Amp Dur Poly Recrt Int Deatra Face Comment  Right Abd Poll Brev Median C8-T1 *Incr *3+ *3+ Nml Nml 0 Nml Nml good MUAP  Right 1stDorInt Ulnar C8-T1 Nml Nml Nml Nml Nml 0 Nml Nml   Right PronatorTeres Median C6-7 Nml Nml Nml Nml Nml 0 Nml Nml  Right Biceps Musculocut C5-6 Nml Nml Nml Nml Nml 0 Nml Nml   Right Deltoid Axillary C5-6 Nml Nml Nml Nml Nml 0 Nml Nml     Nerve Conduction Studies Anti Sensory Left/Right Comparison   Stim Site L Lat (ms) R Lat (ms) L-R Lat (ms) L Amp (V) R Amp (V) L-R Amp (%) Site1 Site2 L Vel (m/s) R Vel (m/s) L-R Vel (m/s)  Median Acr Palm Anti Sensory (2nd Digit)  30.5C  Wrist *7.2 *5.2 2.0 *2.2 *6.1 63.9 Wrist Palm     Palm *4.8 *3.5 1.3 8.5 12.9 34.1       Radial Anti Sensory (Base 1st Digit)  30.3C  Wrist 2.2 2.3 0.1 24.4 16.7 31.6 Wrist Base 1st Digit     Ulnar Anti Sensory (5th Digit)  30.8C  Wrist 3.5 3.4 0.1 23.4 25.5 8.2 Wrist 5th Digit 40 41 1   Motor Left/Right Comparison   Stim Site L Lat (ms) R Lat (ms) L-R Lat (ms) L Amp (mV) R Amp (mV) L-R Amp (%) Site1 Site2 L Vel (m/s) R Vel (m/s) L-R Vel (m/s)  Median Motor (Abd Poll Brev)  30.8C  Wrist *4.8 *8.7 *3.9 5.7 *1.2 *78.9 Elbow Wrist *49 *40 9  Elbow 8.7 13.4 4.7 5.3 1.0 81.1       Radial Motor (Ext Indicis)  31C  8cm *3.1   8.4   Up Arm 8cm 68    Up Arm 5.9   8.3   Axilla Up Arm 83    Axilla 7.1   8.3         Ulnar Motor (Abd Dig Min)  30.8C  Wrist  3.0   7.9  B Elbow Wrist  65   B Elbow  6.1   8.1  A Elbow B Elbow  67   A Elbow  7.6   7.7           Waveforms:                     Clinical History: Narrative & Impression CLINICAL DATA:  Cervicalgia.  Neck pain, chronic.   EXAM: MRI CERVICAL SPINE WITHOUT CONTRAST   TECHNIQUE: Multiplanar, multisequence MR imaging of the cervical spine was performed. No intravenous contrast was administered.   COMPARISON:  Radiographs March 12, 2023.   FINDINGS: Alignment:  Straightening of the cervical curvature with trace anterolisthesis of C4 over C5.   Vertebrae: No fracture, evidence of discitis, or bone lesion.   Cord: Normal signal and morphology.   Posterior Fossa, vertebral arteries, paraspinal tissues: Negative.   Disc levels:   C2-3: No spinal canal or neural foraminal stenosis.   C3-4: No spinal canal or neural foraminal stenosis.   C4-5: No spinal canal or neural foraminal stenosis.   C5-6: Small posterior disc osteophyte complex without significant spinal canal stenosis. Uncovertebral and facet degenerative changes resulting in mild right neural foraminal narrowing.   C6-7: Small posterior disc osteophyte complex without significant spinal canal stenosis. Uncovertebral and facet degenerative changes resulting in mild bilateral neural foraminal narrowing.   C7-T1: No spinal canal or neural foraminal stenosis.   IMPRESSION: 1. Mild degenerative changes of the cervical spine with mild neural foraminal narrowing on the right at C5-6 and bilaterally at C6-7. 2. No high-grade spinal canal stenosis at any level.     Electronically Signed   By: Katyucia  de Macedo Rodrigues M.D.   On: 08/22/2023 10:02   She reports that she has quit smoking. Her smoking  use included cigarettes. She has never used smokeless tobacco. No results for input(s): "HGBA1C", "LABURIC" in the last 8760 hours.  Objective:  VS:  HT:    WT:   BMI:     BP:   HR: bpm  TEMP: ( )  RESP:  Physical Exam Musculoskeletal:        General: No swelling, tenderness or deformity.     Comments: Inspection reveals no atrophy of the bilateral APB or FDI or hand intrinsics. There is no swelling, color changes, allodynia or dystrophic changes. There is 5 out of 5 strength in the bilateral wrist extension, finger abduction and long finger flexion. There is intact sensation to light touch in all dermatomal and peripheral nerve distributions.  There is a positive Phalen's test  bilaterally. There is a negative Hoffmann's test bilaterally.  Skin:    General: Skin is warm and dry.     Findings: No erythema or rash.  Neurological:     General: No focal deficit present.     Mental Status: She is alert and oriented to person, place, and time.     Cranial Nerves: No cranial nerve deficit.     Sensory: Sensory deficit present.     Motor: No weakness or abnormal muscle tone.     Coordination: Coordination normal.     Gait: Gait normal.  Psychiatric:        Mood and Affect: Mood normal.        Behavior: Behavior normal.     Ortho Exam  Imaging: No results found.  Past Medical/Family/Surgical/Social History: Medications & Allergies reviewed per EMR, new medications updated. Patient Active Problem List   Diagnosis Date Noted   Migraine without aura and with status migrainosus, not intractable 03/08/2023   Vitamin D  deficiency 03/08/2023   Hyperlipidemia 03/08/2023   Acquired hypothyroidism 03/08/2023   Intramural leiomyoma of uterus 02/12/2017   DUB (dysfunctional uterine bleeding) 02/12/2017   Family history of cancer of gallbladder 10/15/2015   Past Medical History:  Diagnosis Date   Migraines    Thyroid  disease    Family History  Problem Relation Age of Onset   Cancer Mother        gallbladder    Cancer Father        prostate   Cancer Maternal Grandmother        Gallbladder cancer    Heart disease Sister    Migraines Sister    Breast cancer Neg Hx    Past Surgical History:  Procedure Laterality Date   CESAREAN SECTION     CHOLECYSTECTOMY, LAPAROSCOPIC  01/2016   CYST REMOVAL NECK     PELVIC LAPAROSCOPY     fibroid    SKIN CANCER EXCISION N/A    TONSILLECTOMY AND ADENOIDECTOMY     Social History   Occupational History   Not on file  Tobacco Use   Smoking status: Former    Types: Cigarettes   Smokeless tobacco: Never  Vaping Use   Vaping status: Never Used  Substance and Sexual Activity   Alcohol use: Yes    Comment: rarely    Drug use: No   Sexual activity: Not Currently    Birth control/protection: OCP    Comment: First IC >16 y/o, 5-7 [employer], Hx of +TV

## 2023-11-28 ENCOUNTER — Other Ambulatory Visit: Payer: Self-pay | Admitting: Physical Medicine and Rehabilitation

## 2023-11-28 NOTE — Procedures (Signed)
 EMG & NCV Findings: Evaluation of the left median motor nerve showed prolonged distal onset latency (4.8 ms) and decreased conduction velocity (Elbow-Wrist, 49 m/s).  The right median motor nerve showed prolonged distal onset latency (8.7 ms), reduced amplitude (1.2 mV), and decreased conduction velocity (Elbow-Wrist, 40 m/s).  The left radial motor nerve showed prolonged distal onset latency (3.1 ms).  The left median (across palm) sensory and the right median (across palm) sensory nerves showed prolonged distal peak latency (Wrist, L7.2, R5.2 ms), reduced amplitude (L2.2, R6.1 V), and prolonged distal peak latency (Palm, L4.8, R3.5 ms).  All remaining nerves (as indicated in the following tables) were within normal limits.  Left vs. Right side comparison data for the median motor nerve indicates abnormal L-R latency difference (3.9 ms) and abnormal L-R amplitude difference (78.9 %).  All remaining left vs. right side differences were within normal limits.    Needle evaluation of the right abductor pollicis brevis muscle showed increased insertional activity and increased spontaneous activity.  All remaining muscles (as indicated in the following table) showed no evidence of electrical instability.    Impression: The above electrodiagnostic study is ABNORMAL and reveals evidence of:  a severe right median nerve entrapment at the wrist (carpal tunnel syndrome) affecting sensory and motor components.   a moderate to severe left median nerve entrapment at the wrist (carpal tunnel syndrome) affecting sensory and motor components.   There is no significant electrodiagnostic evidence of any other focal nerve entrapment, brachial plexopathy or cervical radiculopathy.   Recommendations: 1.  Follow-up with referring physician. 2.  Continue current management of symptoms. 3.  Suggest surgical evaluation.  ___________________________ Cassandra Hughes FAAPMR Board Certified, American Board of Physical Medicine  and Rehabilitation    Nerve Conduction Studies Anti Sensory Summary Table   Stim Site NR Peak (ms) Norm Peak (ms) P-T Amp (V) Norm P-T Amp Site1 Site2 Delta-P (ms) Dist (cm) Vel (m/s) Norm Vel (m/s)  Left Median Acr Palm Anti Sensory (2nd Digit)  30.5C  Wrist    *7.2 <3.6 *2.2 >10 Wrist Palm 2.4 0.0    Palm    *4.8 <2.0 8.5         Right Median Acr Palm Anti Sensory (2nd Digit)  30.2C  Wrist    *5.2 <3.6 *6.1 >10 Wrist Palm 1.7 0.0    Palm    *3.5 <2.0 12.9         Left Radial Anti Sensory (Base 1st Digit)  30.3C  Wrist    2.2 <3.1 24.4  Wrist Base 1st Digit 2.2 0.0    Right Radial Anti Sensory (Base 1st Digit)  30.1C  Wrist    2.3 <3.1 16.7  Wrist Base 1st Digit 2.3 0.0    Left Ulnar Anti Sensory (5th Digit)  30.8C  Wrist    3.5 <3.7 23.4 >15.0 Wrist 5th Digit 3.5 14.0 40 >38  Right Ulnar Anti Sensory (5th Digit)  30.6C  Wrist    3.4 <3.7 25.5 >15.0 Wrist 5th Digit 3.4 14.0 41 >38   Motor Summary Table   Stim Site NR Onset (ms) Norm Onset (ms) O-P Amp (mV) Norm O-P Amp Site1 Site2 Delta-0 (ms) Dist (cm) Vel (m/s) Norm Vel (m/s)  Left Median Motor (Abd Poll Brev)  30.8C  Wrist    *4.8 <4.2 5.7 >5 Elbow Wrist 3.9 19.0 *49 >50  Elbow    8.7  5.3         Right Median Motor (Abd Poll Brev)  30.6C  Wrist    *8.7 <4.2 *1.2 >5 Elbow Wrist 4.7 19.0 *40 >50  Elbow    13.4  1.0         Left Radial Motor (Ext Indicis)  31C  8cm    *3.1 <2.5 8.4 >1.7 Up Arm 8cm 2.8 19.0 68 >60  Up Arm    5.9  8.3  Axilla Up Arm 1.2 10.0 83   Axilla    7.1  8.3         Right Ulnar Motor (Abd Dig Min)  30.8C  Wrist    3.0 <4.2 7.9 >3 B Elbow Wrist 3.1 20.0 65 >53  B Elbow    6.1  8.1  A Elbow B Elbow 1.5 10.0 67 >53  A Elbow    7.6  7.7          EMG   Side Muscle Nerve Root Ins Act Fibs Psw Amp Dur Poly Recrt Int Deatra Face Comment  Right Abd Poll Brev Median C8-T1 *Incr *3+ *3+ Nml Nml 0 Nml Nml good MUAP  Right 1stDorInt Ulnar C8-T1 Nml Nml Nml Nml Nml 0 Nml Nml   Right PronatorTeres Median C6-7  Nml Nml Nml Nml Nml 0 Nml Nml   Right Biceps Musculocut C5-6 Nml Nml Nml Nml Nml 0 Nml Nml   Right Deltoid Axillary C5-6 Nml Nml Nml Nml Nml 0 Nml Nml     Nerve Conduction Studies Anti Sensory Left/Right Comparison   Stim Site L Lat (ms) R Lat (ms) L-R Lat (ms) L Amp (V) R Amp (V) L-R Amp (%) Site1 Site2 L Vel (m/s) R Vel (m/s) L-R Vel (m/s)  Median Acr Palm Anti Sensory (2nd Digit)  30.5C  Wrist *7.2 *5.2 2.0 *2.2 *6.1 63.9 Wrist Palm     Palm *4.8 *3.5 1.3 8.5 12.9 34.1       Radial Anti Sensory (Base 1st Digit)  30.3C  Wrist 2.2 2.3 0.1 24.4 16.7 31.6 Wrist Base 1st Digit     Ulnar Anti Sensory (5th Digit)  30.8C  Wrist 3.5 3.4 0.1 23.4 25.5 8.2 Wrist 5th Digit 40 41 1   Motor Left/Right Comparison   Stim Site L Lat (ms) R Lat (ms) L-R Lat (ms) L Amp (mV) R Amp (mV) L-R Amp (%) Site1 Site2 L Vel (m/s) R Vel (m/s) L-R Vel (m/s)  Median Motor (Abd Poll Brev)  30.8C  Wrist *4.8 *8.7 *3.9 5.7 *1.2 *78.9 Elbow Wrist *49 *40 9  Elbow 8.7 13.4 4.7 5.3 1.0 81.1       Radial Motor (Ext Indicis)  31C  8cm *3.1   8.4   Up Arm 8cm 68    Up Arm 5.9   8.3   Axilla Up Arm 83    Axilla 7.1   8.3         Ulnar Motor (Abd Dig Min)  30.8C  Wrist  3.0   7.9  B Elbow Wrist  65   B Elbow  6.1   8.1  A Elbow B Elbow  67   A Elbow  7.6   7.7           Waveforms:

## 2023-11-30 ENCOUNTER — Other Ambulatory Visit: Payer: Self-pay | Admitting: Nurse Practitioner

## 2023-11-30 DIAGNOSIS — Z0000 Encounter for general adult medical examination without abnormal findings: Secondary | ICD-10-CM

## 2023-12-04 ENCOUNTER — Ambulatory Visit
Admission: RE | Admit: 2023-12-04 | Discharge: 2023-12-04 | Discharge status: Home / Self Care | Source: Ambulatory Visit | Attending: Nurse Practitioner

## 2023-12-04 DIAGNOSIS — Z1231 Encounter for screening mammogram for malignant neoplasm of breast: Secondary | ICD-10-CM | POA: Diagnosis not present

## 2023-12-04 DIAGNOSIS — Z0000 Encounter for general adult medical examination without abnormal findings: Secondary | ICD-10-CM

## 2023-12-07 ENCOUNTER — Encounter: Payer: Self-pay | Admitting: Internal Medicine

## 2023-12-10 DIAGNOSIS — E039 Hypothyroidism, unspecified: Secondary | ICD-10-CM | POA: Diagnosis not present

## 2023-12-17 ENCOUNTER — Ambulatory Visit: Admitting: Orthopedic Surgery

## 2023-12-17 DIAGNOSIS — G5603 Carpal tunnel syndrome, bilateral upper limbs: Secondary | ICD-10-CM | POA: Diagnosis not present

## 2023-12-17 NOTE — Progress Notes (Signed)
 Cassandra Hughes - 53 y.o. female MRN [id number]  Date of birth: [birth year]  Office Visit Note: Visit Date: 12/17/2023 PCP: Gavin Kast, FNP Referred by: Gavin Kast, FNP  Subjective: No chief complaint on file.  HPI: Cassandra Hughes is a pleasant 53 y.o. female who presents today for follow-up of hand numbness and tingling of the long and ring finger bilaterally.  She states the symptoms have been present for multiple years, worsening in nature.  She does have a history of cervical degenerative changes at the levels of C5-C6 and C6-C7.  Recently underwent facet joint injections to the C5-C6 and C6-C7 region which she states did help somewhat with her ongoing tingling in the hands.  As for the wrist, she has utilized night bracing with mild relief of her nocturnal symptoms.  No other significant treat modalities have been tried.  She states that she did undergo electrodiagnostic testing years prior, however she is unsure of what the findings were.  Pertinent ROS were reviewed with the patient and found to be negative unless otherwise specified above in HPI.   Assessment & Plan: Visit Diagnoses:  1. Bilateral carpal tunnel syndrome      Plan: Extensive discussion was had with the patient today about her ongoing bilateral carpal tunnel syndrome that is refractory to conservative care.  Patient has both clinical and electrodiagnostic evidence to confirm this diagnosis.  At this juncture, she is indicated for bilateral, staged open versus endoscopic carpal tunnel release.  Risks and benefits of both operations were discussed in detail today as well as forms of anesthesia.  Understanding all risks and benefits, patient would like to have surgery done in the form of bilateral, staged open carpal tunnel release under local anesthesia.  Risks include but not limited to infection, bleeding, scarring, stiffness, nerve injury or vascular, tendon injury, risk of recurrence and need for subsequent  operation were all discussed in detail.  Patient consented understanding the above.  Will move forward surgical scheduling.  She does work in [employer] at an [employer], she feels that it may be prudent for her to have her surgery done in the summertime from a work standpoint given that she has to lift heavy things and does an excessive amount of work with her hands during the day is currently.    She would like to begin with the right side, we will move forward with surgical scheduling of right open carpal tunnel release under local anesthesia in early June which is when her summer begins from a work standpoint.  Will tentatively in 2 perform the left side should she demonstrate appropriate recovery approximately 1 month later.   Follow-up: No follow-ups on file.   Meds & Orders: No orders of the defined types were placed in this encounter.   No orders of the defined types were placed in this encounter.    Procedures: No procedures performed      Clinical History: Narrative & Impression CLINICAL DATA:  Cervicalgia.  Neck pain, chronic.   EXAM: MRI CERVICAL SPINE WITHOUT CONTRAST   TECHNIQUE: Multiplanar, multisequence MR imaging of the cervical spine was performed. No intravenous contrast was administered.   COMPARISON:  Radiographs March 12, 2023.   FINDINGS: Alignment: Straightening of the cervical curvature with trace anterolisthesis of C4 over C5.   Vertebrae: No fracture, evidence of discitis, or bone lesion.   Cord: Normal signal and morphology.   Posterior Fossa, vertebral arteries, paraspinal tissues: Negative.   Disc levels:   C2-3:  No spinal canal or neural foraminal stenosis.   C3-4: No spinal canal or neural foraminal stenosis.   C4-5: No spinal canal or neural foraminal stenosis.   C5-6: Small posterior disc osteophyte complex without significant spinal canal stenosis. Uncovertebral and facet degenerative changes resulting in mild right neural  foraminal narrowing.   C6-7: Small posterior disc osteophyte complex without significant spinal canal stenosis. Uncovertebral and facet degenerative changes resulting in mild bilateral neural foraminal narrowing.   C7-T1: No spinal canal or neural foraminal stenosis.   IMPRESSION: 1. Mild degenerative changes of the cervical spine with mild neural foraminal narrowing on the right at C5-6 and bilaterally at C6-7. 2. No high-grade spinal canal stenosis at any level.     Electronically Signed   By: Katyucia  de Macedo Rodrigues M.D.   On: 08/22/2023 10:02  She reports that she has quit smoking. Her smoking use included cigarettes. She has never used smokeless tobacco. No results for input(s): "HGBA1C", "LABURIC" in the last 8760 hours.  Objective:   Vital Signs: There were no vitals taken for this visit.  Physical Exam  Gen: Well-appearing, in no acute distress; non-toxic CV: Regular Rate. Well-perfused. Warm.  Resp: Breathing unlabored on room air; no wheezing. Psych: Fluid speech in conversation; appropriate affect; normal thought process  Ortho Exam PHYSICAL EXAM:  General: Patient is well appearing and in no distress. Cervical spine mobility is full in all directions:  Skin and Muscle: No significant skin changes are apparent to upper extremities.   Range of Motion and Palpation Tests: Mobility is full about the elbows with flexion and extension.  Forearm supination and pronation are 85/85 bilaterally.  Wrist flexion/extension is 75/65 bilaterally.  Digital flexion and extension are full.  Thumb opposition is full to the base of the small fingers bilaterally.    No cords or nodules are palpated.  No triggering is observed.     Neurologic, Vascular, Motor: Sensation is diminished to light touch in the bilateral median distribution.    Thenar atrophy: Mild right, negative left Tinel sign: Positive bilateral carpal tunnel Carpal tunnel compression: Positive  bilateral Phalen test: Positive bilateral  Sensory bilateral hand 2-point discrimination (thumb, index, middle): 10 mm right, 8 mm left  Motor bilateral hand APB: 5/5 Able to perform thumb opposition  Fingers pink and well perfused.  Capillary refill is brisk.     No results found for: "HGBA1C"   Imaging: No results found.   Past Medical/Family/Surgical/Social History: Medications & Allergies reviewed per EMR, new medications updated. Patient Active Problem List   Diagnosis Date Noted   Migraine without aura and with status migrainosus, not intractable 03/08/2023   Vitamin D  deficiency 03/08/2023   Hyperlipidemia 03/08/2023   Acquired hypothyroidism 03/08/2023   Intramural leiomyoma of uterus 02/12/2017   DUB (dysfunctional uterine bleeding) 02/12/2017   Family history of cancer of gallbladder 10/15/2015   Past Medical History:  Diagnosis Date   Migraines    Thyroid  disease    Family History  Problem Relation Age of Onset   Cancer Mother        gallbladder    Cancer Father        prostate   Cancer Maternal Grandmother        Gallbladder cancer    Heart disease Sister    Migraines Sister    Breast cancer Neg Hx    Past Surgical History:  Procedure Laterality Date   CESAREAN SECTION     CHOLECYSTECTOMY, LAPAROSCOPIC  01/2016   CYST  REMOVAL NECK     PELVIC LAPAROSCOPY     fibroid    SKIN CANCER EXCISION N/A    TONSILLECTOMY AND ADENOIDECTOMY     Social History   Occupational History   Not on file  Tobacco Use   Smoking status: Former    Types: Cigarettes   Smokeless tobacco: Never  Vaping Use   Vaping status: Never Used  Substance and Sexual Activity   Alcohol use: Yes    Comment: rarely   Drug use: No   Sexual activity: Not Currently    Birth control/protection: OCP    Comment: First IC >16 y/o, 5-7 Partners, Hx of +TV    Tesha Archambeau Merlinda Starling) Izael Bessinger, M.D. [facility], Hand Surgery

## 2023-12-25 NOTE — Progress Notes (Unsigned)
 [facility] [address] [place] [address] Dept: [phone] Dept Fax: [phone]    Subjective:   Cassandra Hughes 1970-10-19 12/26/2023  No chief complaint on file.   HPI: Cassandra Hughes presents today for re-assessment and management of chronic medical conditions.  Discussed the use of AI scribe software for clinical note transcription with the patient, who gave verbal consent to proceed.  History of Present Illness        The following portions of the patient's history were reviewed and updated as appropriate: past medical history, past surgical history, family history, social history, allergies, medications, and problem list.   Patient Active Problem List   Diagnosis Date Noted   Migraine without aura and with status migrainosus, not intractable 03/08/2023   Vitamin D  deficiency 03/08/2023   Hyperlipidemia 03/08/2023   Acquired hypothyroidism 03/08/2023   Intramural leiomyoma of uterus 02/12/2017   DUB (dysfunctional uterine bleeding) 02/12/2017   Family history of cancer of gallbladder 10/15/2015   Past Medical History:  Diagnosis Date   Migraines    Thyroid  disease    Past Surgical History:  Procedure Laterality Date   CESAREAN SECTION     CHOLECYSTECTOMY, LAPAROSCOPIC  01/2016   CYST REMOVAL NECK     PELVIC LAPAROSCOPY     fibroid    SKIN CANCER EXCISION N/A    TONSILLECTOMY AND ADENOIDECTOMY     Family History  Problem Relation Age of Onset   Cancer Mother        gallbladder    Cancer Father        prostate   Cancer Maternal Grandmother        Gallbladder cancer    Heart disease Sister    Migraines Sister    Breast cancer Neg Hx     Current Outpatient Medications:    Cholecalciferol (VITAMIN D3) 1.25 MG (50000 UT) CAPS, Take 1 capsule (1.25 mg total) by mouth once a week., Disp: 12 capsule, Rfl: 0   doxycycline (VIBRAMYCIN) 100 MG capsule, Take 100 mg by mouth daily., Disp: ,  Rfl:    levonorgestrel -ethinyl estradiol (LESSINA-28) 0.1-20 MG-MCG tablet, Take ACTIVE pills only, skipping PLACEBO pills, Disp: 112 tablet, Rfl: 3   meloxicam  (MOBIC ) 15 MG tablet, TAKE 1 TABLET BY MOUTH DAILY, Disp: 30 tablet, Rfl: 0   methocarbamol  (ROBAXIN ) 500 MG tablet, Take 1 tablet (500 mg total) by mouth 3 (three) times daily. (Patient not taking: Reported on 11/12/2023), Disp: 90 tablet, Rfl: 0   SUMAtriptan  (IMITREX ) 50 MG tablet, TAKE 1 TABLET BY MOUTH AT ONSET OF MIGRAINE; MAY REPEAT 1 TABLET IN 2 HOURS IF NEEDED., Disp: 10 tablet, Rfl: 2   thyroid  (ARMOUR) 120 MG tablet, as directed., Disp: , Rfl:    thyroid  (ARMOUR) 30 MG tablet, as directed., Disp: , Rfl:  Allergies  Allergen Reactions   Vicodin [Hydrocodone-Acetaminophen] Nausea Only     ROS: A complete ROS was performed with pertinent positives/negatives noted in the HPI. The remainder of the ROS are negative.    Objective:   There were no vitals filed for this visit.  GENERAL: Well-appearing, in NAD. Well nourished.  SKIN: Pink, warm and dry. No rash, lesion, ulceration, or ecchymoses.  NECK: Trachea midline. Full ROM w/o pain or tenderness. No lymphadenopathy.  RESPIRATORY: Chest wall symmetrical. Respirations even and non-labored. Breath sounds clear to auscultation bilaterally.  CARDIAC: S1, S2 present, regular rate and rhythm. Peripheral pulses 2+ bilaterally.  MSK: Muscle tone and strength appropriate for age.  Joints w/o tenderness, redness, or swelling.  EXTREMITIES: Without clubbing, cyanosis, or edema.  NEUROLOGIC: No motor or sensory deficits. Steady, even gait.  PSYCH/MENTAL STATUS: Alert, oriented x 3. Cooperative, appropriate mood and affect.   Health Maintenance Due  Topic Date Due   Zoster Vaccines- Shingrix (1 of 2) Never done   COVID-19 Vaccine (4 - Pfizer risk 2024-25 season) 12/05/2023    No results found for any visits on 12/26/23.  The 10-year ASCVD risk score (Arnett DK, et al., 2019) is:  1.7%     Assessment & Plan:  Assessment and Plan Assessment & Plan      Acquired hypothyroidism  Vitamin D  deficiency   No orders of the defined types were placed in this encounter.  No images are attached to the encounter or orders placed in the encounter. No orders of the defined types were placed in this encounter.   No follow-ups on file.   Gavin Kast, FNP

## 2023-12-26 ENCOUNTER — Other Ambulatory Visit (HOSPITAL_COMMUNITY): Payer: Self-pay

## 2023-12-26 ENCOUNTER — Ambulatory Visit: Payer: Medicaid Other | Admitting: Internal Medicine

## 2023-12-26 ENCOUNTER — Encounter: Payer: Self-pay | Admitting: Internal Medicine

## 2023-12-26 ENCOUNTER — Telehealth: Payer: Self-pay

## 2023-12-26 VITALS — BP 124/84 | HR 78 | Temp 97.9°F | Ht 63.75 in | Wt 193.0 lb

## 2023-12-26 DIAGNOSIS — M47812 Spondylosis without myelopathy or radiculopathy, cervical region: Secondary | ICD-10-CM

## 2023-12-26 DIAGNOSIS — G43001 Migraine without aura, not intractable, with status migrainosus: Secondary | ICD-10-CM | POA: Diagnosis not present

## 2023-12-26 DIAGNOSIS — E559 Vitamin D deficiency, unspecified: Secondary | ICD-10-CM | POA: Diagnosis not present

## 2023-12-26 DIAGNOSIS — E039 Hypothyroidism, unspecified: Secondary | ICD-10-CM

## 2023-12-26 DIAGNOSIS — Z23 Encounter for immunization: Secondary | ICD-10-CM

## 2023-12-26 LAB — VITAMIN D 25 HYDROXY (VIT D DEFICIENCY, FRACTURES): VITD: 36.39 ng/mL (ref 30.00–100.00)

## 2023-12-26 LAB — BASIC METABOLIC PANEL WITH GFR
BUN: 18 mg/dL (ref 6–23)
CO2: 27 meq/L (ref 19–32)
Calcium: 9.1 mg/dL (ref 8.4–10.5)
Chloride: 104 meq/L (ref 96–112)
Creatinine, Ser: 0.74 mg/dL (ref 0.40–1.20)
GFR: 92.65 mL/min (ref >60.00)
Glucose, Bld: 88 mg/dL (ref 70–99)
Potassium: 4 meq/L (ref 3.5–5.1)
Sodium: 138 meq/L (ref 135–145)

## 2023-12-26 MED ORDER — MELOXICAM 15 MG PO TABS: 15.0000 mg | ORAL_TABLET | Freq: Every day | ORAL | 1 refills | Status: DC

## 2023-12-26 MED ORDER — NURTEC 75 MG PO TBDP: ORAL_TABLET | ORAL | 1 refills | Status: DC

## 2023-12-26 NOTE — Telephone Encounter (Signed)
 Pharmacy Patient Advocate Encounter   Received notification from Patient Pharmacy that prior authorization for Nurtec is required/requested.   Insurance verification completed.   The patient is insured through [employer] .   Per test claim: PA required; PA submitted to above mentioned insurance via CoverMyMeds Key/confirmation #/EOC [id number] Status is pending

## 2023-12-27 ENCOUNTER — Ambulatory Visit: Payer: Self-pay | Admitting: Internal Medicine

## 2023-12-27 NOTE — Telephone Encounter (Signed)
 Sent patient a MyChart message asking what migraine medication your insurance will cover

## 2023-12-27 NOTE — Telephone Encounter (Signed)
 Pharmacy Patient Advocate Encounter  Received notification from [facility] that Prior Authorization for Nurtec 75 has been DENIED.  No reason given; No denial letter received via Fax or CMM. It has been requested and will be uploaded to the media tab once received.   PA #/Case ID/Reference #: [id number]

## 2023-12-28 ENCOUNTER — Other Ambulatory Visit: Payer: Self-pay | Admitting: Internal Medicine

## 2023-12-28 ENCOUNTER — Other Ambulatory Visit (HOSPITAL_COMMUNITY): Payer: Self-pay

## 2023-12-28 ENCOUNTER — Telehealth: Payer: Self-pay

## 2023-12-28 DIAGNOSIS — G43001 Migraine without aura, not intractable, with status migrainosus: Secondary | ICD-10-CM

## 2023-12-28 MED ORDER — RIZATRIPTAN BENZOATE 10 MG PO TABS: 10.0000 mg | ORAL_TABLET | ORAL | 0 refills | Status: DC | PRN

## 2023-12-28 NOTE — Telephone Encounter (Signed)
 Pharmacy Patient Advocate Encounter   Received notification from Physician's Office that prior authorization for Rizatriptan 10 tabs is required/requested.   Insurance verification completed.   The patient is insured through [employer] .   Per test claim: The current 30 day co-pay is, $4.00 for 16 tabs.  No PA needed at this time. This test claim was processed through [facility]- copay amounts may vary at other pharmacies due to pharmacy/plan contracts, or as the patient moves through the different stages of their insurance plan.

## 2024-01-22 ENCOUNTER — Other Ambulatory Visit: Payer: Self-pay | Admitting: Orthopedic Surgery

## 2024-01-22 DIAGNOSIS — G5603 Carpal tunnel syndrome, bilateral upper limbs: Secondary | ICD-10-CM

## 2024-01-24 ENCOUNTER — Other Ambulatory Visit: Payer: Self-pay | Admitting: Orthopedic Surgery

## 2024-01-24 DIAGNOSIS — G5601 Carpal tunnel syndrome, right upper limb: Secondary | ICD-10-CM | POA: Diagnosis not present

## 2024-01-24 MED ORDER — ACETAMINOPHEN-CODEINE 300-30 MG PO TABS: 1.0000 | ORAL_TABLET | Freq: Four times a day (QID) | ORAL | 0 refills | Status: DC | PRN

## 2024-02-06 ENCOUNTER — Ambulatory Visit (INDEPENDENT_AMBULATORY_CARE_PROVIDER_SITE_OTHER): Admitting: Orthopedic Surgery

## 2024-02-06 DIAGNOSIS — G5602 Carpal tunnel syndrome, left upper limb: Secondary | ICD-10-CM

## 2024-02-06 DIAGNOSIS — Z9889 Other specified postprocedural states: Secondary | ICD-10-CM

## 2024-02-06 DIAGNOSIS — G5603 Carpal tunnel syndrome, bilateral upper limbs: Secondary | ICD-10-CM

## 2024-02-06 NOTE — Progress Notes (Unsigned)
   [name] - 53 y.o. female MRN [id number]  Date of birth: 25-Nov-1970  Office Visit Note: Visit Date: 02/06/2024 PCP: Billy Knee, FNP Referred by: Billy Knee, FNP  Subjective:  HPI: Cassandra Hughes is a 53 y.o. female who presents today for follow up 2 weeks status post right open carpal tunnel release.  Pertinent ROS were reviewed with the patient and found to be negative unless otherwise specified above in HPI.   Assessment & Plan: Visit Diagnoses: No diagnosis found.  Plan: ***  Follow-up: No follow-ups on file.   Meds & Orders: No orders of the defined types were placed in this encounter.  No orders of the defined types were placed in this encounter.    Procedures: No procedures performed       Objective:   Vital Signs: There were no vitals taken for this visit.  Ortho Exam ***  Imaging: No results found.   Darnell Stimson [name], M.D. [facility], Hand Surgery

## 2024-02-07 ENCOUNTER — Encounter: Admitting: Orthopedic Surgery

## 2024-02-08 ENCOUNTER — Other Ambulatory Visit: Payer: Self-pay | Admitting: Internal Medicine

## 2024-02-08 DIAGNOSIS — G43001 Migraine without aura, not intractable, with status migrainosus: Secondary | ICD-10-CM

## 2024-02-11 NOTE — Therapy (Signed)
 OUTPATIENT OCCUPATIONAL THERAPY ORTHO EVALUATION  Patient Name: Cassandra Hughes MRN: [id number] DOB:[birth year], 53 y.o., female Today's Date: 02/12/2024  PCP: Billy Knee, FNP REFERRING PROVIDER: Arlinda Buster, MD  END OF SESSION:  OT End of Session - 02/12/24 1455     Visit Number 1    Number of Visits 12    Date for OT Re-Evaluation 04/14/24    Authorization Type [employer] MCD - NO auth required, 27 visits    OT Start Time 1315    OT Stop Time 1412    OT Time Calculation (min) 57 min    Activity Tolerance Patient tolerated treatment well    Behavior During Therapy WFL for tasks assessed/performed          Past Medical History:  Diagnosis Date   Migraines    Thyroid  disease    Past Surgical History:  Procedure Laterality Date   CESAREAN SECTION     CHOLECYSTECTOMY, LAPAROSCOPIC  01/2016   CYST REMOVAL NECK     PELVIC LAPAROSCOPY     fibroid    SKIN CANCER EXCISION N/A    TONSILLECTOMY AND ADENOIDECTOMY     Patient Active Problem List   Diagnosis Date Noted   Migraine without aura and with status migrainosus, not intractable 03/08/2023   Vitamin D  deficiency 03/08/2023   Hyperlipidemia 03/08/2023   Acquired hypothyroidism 03/08/2023   Intramural leiomyoma of uterus 02/12/2017   DUB (dysfunctional uterine bleeding) 02/12/2017   Family history of cancer of gallbladder 10/15/2015    ONSET DATE: 02/11/2024  REFERRING DIAG: G56.02 (ICD-10-CM) - Carpal tunnel syndrome, left upper limbG56.03 (ICD-10-CM) - Bilateral carpal tunnel syndrome  Note: Right  OPEN CARPAL TUNNEL RELEASE, 64721 S/p Rt carpal tunnel release 01/24/24  THERAPY DIAG:  Muscle weakness (generalized)  Other disturbances of skin sensation  Localized edema  Rationale for Evaluation and Treatment: Rehabilitation  SUBJECTIVE:   SUBJECTIVE STATEMENT: I have surgery for my Lt hand next week (02/21/24)  Pt accompanied by: self  PERTINENT HISTORY: Migraines, thyroid   disease  PRECAUTIONS: Other: s/p Rt CTR - No strengthening yet until 4 weeks post-op    WEIGHT BEARING RESTRICTIONS: limited per protocol  PAIN:  Are you having pain? None at time of eval but occasional nerve pain  FALLS: Has patient fallen in last 6 months? No  LIVING ENVIRONMENT: Lives with: lives with their family (20 y.o. 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  HAND DOMINANCE: Right  ADLs: Overall ADLs: mod I for BADLs, doing most IADLS w/ son to assist prn for heavier lifting  FUNCTIONAL OUTCOME MEASURES: Quick Dash: 16% RUE Deficit  UPPER EXTREMITY ROM:   BUE AROM WNLS   HAND FUNCTION: Unable to assess grip strength d/t current precautions  COORDINATION: 9 Hole Peg test: Right: 18 sec; Left: 19 sec  SENSATION: Still reports numbness tip of long finger and radial side of ring finger. Thumb sensation improved and pain improved since surgery  EDEMA: mild   COGNITION: Overall cognitive status: Within functional limits for tasks assessed   OBSERVATIONS: stitches removed, incision healing nicely   TREATMENT DATE: 02/12/24  Pt shown scar massage and instructed to perform for 5 minutes 2x/day - see pt instructions  Pt issued CTR home program (per [place]  protocol) and return demo Pt issued median nerve gliding ex's and return demo  Pt issued tendon gliding ex's and return demo of each - see pt instructions Pt issued silopas gel padding for scar remodeling to wear at night under tensogrip  (compression stockinette)    PATIENT EDUCATION: Education details: see above Person educated: Patient Education method: Programmer, multimedia, Demonstration, Verbal cues, and Handouts Education comprehension: verbalized understanding, returned demonstration, and verbal cues required  HOME EXERCISE PROGRAM: 02/12/24: CTR home program, median nerve gliding ex's, tendon gliding ex's, scar massage  GOALS: Goals reviewed with patient? Yes    SHORT TERM GOALS: Target date: 03/14/24   Patient will demonstrate initial RUE HEP with 25% verbal cues or less for proper execution. Baseline: New to outpt OT Goal status: IN Progress - Tendon Gliding Exc issued at eval.     2.  Pt will independently recall at least 3 joint protection, ergonomics, and body mechanic principles as noted in pt instructions to assist with daily tasks with increased comfort and confidence.  Baseline:  New to outpt OT Goal status: INITIAL   3.  Patient will demonstrate at least 25 lbs grip strength Rt hand to open jars and other containers. Baseline: unable to assess d/t current precautions  Goal status: INITIAL   5.  Pt will improve Quick Dash for RUE as evidenced by 5% deficit decrease  Baseline: 16% Goal status: INITIAL     LONG TERM GOALS: Target date: 04/14/24   Patient will demonstrate updated RUE and LUE HEP with visual instruction only for proper execution. Baseline: unable to progress d/t current precautions Goal status: INITIAL    2.  Pt will return to using Rt hand as dominant hand for all ADLS, and Lt hand as assist  Baseline:  New to outpt OT Goal status: INITIAL   3.  Pt will be independent with home based pain management routine to potentially include gloves/splints, heat and joint protection principles for minimal pain. Baseline: not yet addressed Goal status: INITIAL   4.  Patient will be assessed for Quick Dash on LUE following Lt CTR and then show 10% deficit reduction by discharge  Baseline: TBA Goal  status: INITIAL  5. Pt will demo 35 lbs or greater grip strength on Rt dominant hand and 20 lbs or greater grip strength on Lt hand   Baseline: unable to assess d/t current precautions  Goal status: INITIAL      ASSESSMENT:  CLINICAL IMPRESSION: Patient is a 53 y.o. female who was seen today for occupational therapy evaluation for bilateral CTS s/p CTR RUE on 01/24/24 and upcoming CTR to LUE on 02/21/24 (next Thursday). Pt seen today for evaluation and initiation of scar massage and HEP for RUE. Pt will be followed by skilled O.T. to address deficits following CTR RUE and then to also include CTR LUE when appropriate.   PERFORMANCE DEFICITS: in functional skills including ADLs, IADLs, coordination, proprioception, sensation, edema, ROM, strength, pain, Fine motor control, decreased knowledge of precautions, decreased knowledge of use of DME, skin integrity, and UE functional use.   IMPAIRMENTS: are limiting patient from ADLs, IADLs, rest and sleep, work, leisure, and social participation.   COMORBIDITIES: may have co-morbidities  that affects occupational performance. Patient will benefit from skilled OT to address above impairments and improve overall function.  MODIFICATION OR ASSISTANCE TO COMPLETE EVALUATION: No modification  of tasks or assist necessary to complete an evaluation.  OT OCCUPATIONAL PROFILE AND HISTORY: Detailed assessment: Review of records and additional review of physical, cognitive, psychosocial history related to current functional performance.  CLINICAL DECISION MAKING: Moderate - several treatment options, min-mod task modification necessary  REHAB POTENTIAL: Good  EVALUATION COMPLEXITY: Moderate      PLAN:  OT FREQUENCY: 1-2x/week  OT DURATION: 8 weeks  PLANNED INTERVENTIONS: 97535 self care/ADL training, 02889 therapeutic exercise, 97530 therapeutic activity, 97140 manual therapy, 97035 ultrasound, 97018 paraffin, 02960 fluidotherapy, 97010 moist heat,  97010 cryotherapy, 97034 contrast bath, 97760 Orthotic Initial, 97763 Orthotic/Prosthetic subsequent, scar mobilization, passive range of motion, compression bandaging, patient/family education, and DME and/or AE instructions  RECOMMENDED OTHER SERVICES: none at this time  CONSULTED AND AGREED WITH PLAN OF CARE: Patient  PLAN FOR NEXT SESSION: Pt to return 7/9 and 7/15 to work on RUE (can begin light strengthening with putty and pulsed US , and review of HEP's issued today). Pt scheduled for surgery for LT CTR on 02/21/24 and will begin addressing LUE the following 1-2 weeks post-op per protocol.    Burnard JINNY Roads, OT 02/12/2024, 2:57 PM

## 2024-02-12 ENCOUNTER — Ambulatory Visit: Attending: Orthopedic Surgery | Admitting: Occupational Therapy

## 2024-02-12 DIAGNOSIS — M6281 Muscle weakness (generalized): Secondary | ICD-10-CM | POA: Diagnosis not present

## 2024-02-12 DIAGNOSIS — G5603 Carpal tunnel syndrome, bilateral upper limbs: Secondary | ICD-10-CM | POA: Diagnosis not present

## 2024-02-12 DIAGNOSIS — R208 Other disturbances of skin sensation: Secondary | ICD-10-CM | POA: Diagnosis not present

## 2024-02-12 DIAGNOSIS — M79641 Pain in right hand: Secondary | ICD-10-CM | POA: Diagnosis not present

## 2024-02-12 DIAGNOSIS — M79642 Pain in left hand: Secondary | ICD-10-CM | POA: Insufficient documentation

## 2024-02-12 DIAGNOSIS — R600 Localized edema: Secondary | ICD-10-CM | POA: Diagnosis not present

## 2024-02-12 NOTE — Patient Instructions (Signed)
 SABRA

## 2024-02-18 ENCOUNTER — Ambulatory Visit (INDEPENDENT_AMBULATORY_CARE_PROVIDER_SITE_OTHER): Admitting: Orthopedic Surgery

## 2024-02-18 DIAGNOSIS — G5603 Carpal tunnel syndrome, bilateral upper limbs: Secondary | ICD-10-CM

## 2024-02-18 DIAGNOSIS — G5602 Carpal tunnel syndrome, left upper limb: Secondary | ICD-10-CM

## 2024-02-18 NOTE — Progress Notes (Unsigned)
   Cassandra Hughes - 53 y.o. female MRN [id number]  Date of birth: 1971-05-08  Office Visit Note: Visit Date: 02/18/2024 PCP: Billy Knee, FNP Referred by: Billy Knee, FNP  Subjective:  HPI: Cassandra Hughes is a 53 y.o. female who presents today for follow up 4 weeks status post right open carpal tunnel release.  She is doing well overall from the right side, has plans for upcoming left open carpal tunnel release later this week.  Pertinent ROS were reviewed with the patient and found to be negative unless otherwise specified above in HPI.   Assessment & Plan: Visit Diagnoses:  1. Bilateral carpal tunnel syndrome     Plan: She continues to heal well from the right side and is progressing nicely with home exercises and activities as tolerated.  We can proceed with left open carpal tunnel release later this week as initially scheduled.  Risks and benefits of the procedure were once again discussed, risks including but not limited to infection, bleeding, scarring, stiffness, nerve injury, tendon injury, vascular injury,, recurrence of symptoms and need for subsequent operation.  We also discussed the appropriate postoperative protocol and timeframe for return to activities and function.  Patient expressed understanding.    Risks and benefits of the procedure were discussed, risks including but not limited to infection, bleeding, scarring, stiffness, nerve injury, tendon injury, vascular injury, recurrence of symptoms and need for subsequent operation.  We also discussed the appropriate postoperative protocol and timeframe for return to activities and function.  Patient expressed understanding.      Follow-up: No follow-ups on file.   Meds & Orders: No orders of the defined types were placed in this encounter.   Orders Placed This Encounter  Procedures   Ambulatory referral to Occupational Therapy     Procedures: No procedures performed       Objective:   Vital Signs: There  were no vitals taken for this visit.  Ortho Exam Right hand: - Well-healed palmar incision - Composite fist without restriction - Sensation intact light touch in median nerve distribution - 5/5 APB no thenar atrophy, thumb opposition to small finger DPC - Grip strength Jamar 2, Right 40, Left 50  Left hand: - Positive Tinel's carpal tunnel, positive Durkan's compression, positive Phalen's - 2 point discrimination left hand median distribution is 8 mm - APB 5/5 without thenar atrophy, thumb opposition intact   Imaging: No results found.   Annalicia Renfrew [name], M.D. [facility], Hand Surgery

## 2024-02-20 ENCOUNTER — Encounter: Payer: Self-pay | Admitting: Occupational Therapy

## 2024-02-20 ENCOUNTER — Other Ambulatory Visit: Payer: Self-pay | Admitting: Orthopedic Surgery

## 2024-02-20 ENCOUNTER — Ambulatory Visit: Payer: Self-pay | Admitting: Occupational Therapy

## 2024-02-20 DIAGNOSIS — R600 Localized edema: Secondary | ICD-10-CM | POA: Diagnosis not present

## 2024-02-20 DIAGNOSIS — M79641 Pain in right hand: Secondary | ICD-10-CM | POA: Diagnosis not present

## 2024-02-20 DIAGNOSIS — R208 Other disturbances of skin sensation: Secondary | ICD-10-CM

## 2024-02-20 DIAGNOSIS — M79642 Pain in left hand: Secondary | ICD-10-CM

## 2024-02-20 DIAGNOSIS — M6281 Muscle weakness (generalized): Secondary | ICD-10-CM

## 2024-02-20 DIAGNOSIS — G5603 Carpal tunnel syndrome, bilateral upper limbs: Secondary | ICD-10-CM | POA: Diagnosis not present

## 2024-02-20 MED ORDER — ACETAMINOPHEN-CODEINE 300-30 MG PO TABS: 1.0000 | ORAL_TABLET | Freq: Four times a day (QID) | ORAL | 0 refills | Status: DC | PRN

## 2024-02-20 NOTE — Therapy (Signed)
 OUTPATIENT OCCUPATIONAL THERAPY ORTHO TREATMENT  Patient Name: Cassandra Hughes MRN: [id number] DOB:1971/05/26, 53 y.o., female Today's Date: 02/20/2024  PCP: Billy Knee, FNP REFERRING PROVIDER: Arlinda Buster, MD  END OF SESSION:  OT End of Session - 02/20/24 0850     Visit Number 2    Number of Visits 12    Date for OT Re-Evaluation 04/14/24    Authorization Type [employer] - NO auth required, 27 visits    OT Start Time 0848    OT Stop Time 0930    OT Time Calculation (min) 42 min    Activity Tolerance Patient tolerated treatment well    Behavior During Therapy [facility] for tasks assessed/performed          Past Medical History:  Diagnosis Date   Migraines    Thyroid  disease    Past Surgical History:  Procedure Laterality Date   CESAREAN SECTION     CHOLECYSTECTOMY, LAPAROSCOPIC  01/2016   CYST REMOVAL NECK     PELVIC LAPAROSCOPY     fibroid    SKIN CANCER EXCISION N/A    TONSILLECTOMY AND ADENOIDECTOMY     Patient Active Problem List   Diagnosis Date Noted   Migraine without aura and with status migrainosus, not intractable 03/08/2023   Vitamin D  deficiency 03/08/2023   Hyperlipidemia 03/08/2023   Acquired hypothyroidism 03/08/2023   Intramural leiomyoma of uterus 02/12/2017   DUB (dysfunctional uterine bleeding) 02/12/2017   Family history of cancer of gallbladder 10/15/2015    ONSET DATE: 02/11/2024  REFERRING DIAG: G56.02 (ICD-10-CM) - Carpal tunnel syndrome, left upper limbG56.03 (ICD-10-CM) - Bilateral carpal tunnel syndrome  Note: Right  OPEN CARPAL TUNNEL RELEASE, 64721 S/p Rt carpal tunnel release 01/24/24  THERAPY DIAG:  Muscle weakness (generalized)  Other disturbances of skin sensation  Localized edema  Pain in right hand  Pain in left hand  Rationale for Evaluation and Treatment: Rehabilitation  SUBJECTIVE:   SUBJECTIVE STATEMENT: I have surgery for my Lt hand tomorrow. No pain Rt hand now but I did have a burning sensation  yesterday and a little this morning but then it was gone (ulnar volar wrist)  Pt accompanied by: self  PERTINENT HISTORY: Migraines, thyroid  disease  PRECAUTIONS: Other: s/p Rt CTR - No strengthening yet until 4 weeks post-op    WEIGHT BEARING RESTRICTIONS: limited per protocol  PAIN:  Are you having pain? None at time of eval but occasional nerve pain  FALLS: Has patient fallen in last 6 months? No  LIVING ENVIRONMENT: Lives with: lives with their family (27 y.o. 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  HAND DOMINANCE: Right  ADLs: Overall ADLs: mod I for BADLs, doing most IADLS w/ son to assist prn for heavier lifting  FUNCTIONAL OUTCOME MEASURES: Quick Dash: 16% RUE Deficit  UPPER EXTREMITY ROM:   BUE AROM WNLS   HAND FUNCTION: Unable to assess grip strength d/t current precautions  COORDINATION: 9 Hole Peg test: Right: 18 sec; Left: 19 sec  SENSATION: Still reports numbness tip of long finger and radial side of ring finger. Thumb sensation improved and pain improved since surgery  EDEMA:  mild   COGNITION: Overall cognitive status: Within functional limits for tasks assessed   OBSERVATIONS: stitches removed, incision healing nicely   TREATMENT DATE: 02/20/24                                                                                                                            Ultrasound x 8 minutes, 3 Mhz, 0.8 wts/cm2, 20% pulsed over scar incision.   Briefly reviewed previously issued HEP's  Pt issued putty HEP - see pt instructions for details. Pt issued yellow putty  Pt also issued gel ball and  textured roller for scar massage and desensitization for Rt hand as pt will not be able to perform with Lt hand after tomorrow (d/t Lt CTR on 02/21/24)   Recommended jar opener and electric can opener for future use to assist with opening jars/cans. Will not be able to use immediately after Lt CTR but pt reports she has family to assist.      PATIENT EDUCATION: Education details: see above Person educated: Patient Education method: Programmer, multimedia, Demonstration, Verbal cues, and Handouts Education comprehension: verbalized understanding, returned demonstration, and verbal cues required  HOME EXERCISE PROGRAM: 02/12/24: CTR home program, median nerve gliding ex's, tendon gliding ex's, scar massage (for RUE)  02/20/24: putty HEP (for RUE)   GOALS: Goals reviewed with patient? Yes    SHORT TERM GOALS: Target date: 03/14/24   Patient will demonstrate initial RUE HEP with 25% verbal cues or less for proper execution. Baseline: New to outpt OT Goal status: MET      2.  Pt will independently recall at least 3 joint protection, ergonomics, and body mechanic principles as noted in pt instructions to assist with daily tasks with increased comfort and confidence.  Baseline:  New to outpt OT Goal status: IN PROGRESS   3.  Patient will demonstrate at least 25 lbs grip strength Rt hand to open jars and other containers. Baseline: unable to assess d/t current precautions  Goal status: IN PROGRESS   5.  Pt will improve Quick Dash for RUE as evidenced by 5% deficit decrease  Baseline: 16% Goal status: INITIAL     LONG TERM GOALS: Target date: 04/14/24   Patient will demonstrate updated RUE and LUE HEP with visual instruction only for proper execution. Baseline: unable to progress d/t current precautions Goal status: INITIAL    2.  Pt will return to using Rt hand as dominant hand for all ADLS, and Lt hand as assist  Baseline:  New to outpt OT Goal status: INITIAL   3.  Pt will be independent  with home based pain management routine to potentially include gloves/splints, heat and joint protection principles for minimal pain. Baseline: not yet addressed Goal status: INITIAL   4.  Patient will be assessed for Quick Dash on LUE following Lt CTR and then show 10% deficit reduction by discharge  Baseline: TBA Goal status: INITIAL  5. Pt will demo 35 lbs or greater  grip strength on Rt dominant hand and 20 lbs or greater grip strength on Lt hand   Baseline: unable to assess d/t current precautions  Goal status: INITIAL      ASSESSMENT:  CLINICAL IMPRESSION: Patient is a 53 y.o. female who was seen today for occupational therapy treatment for bilateral CTS s/p CTR RUE on 01/24/24 and upcoming CTR to LUE on 02/21/24 (tomorrow). Pt seen today for light strengthening for RUE. Pt will be followed by skilled O.T. to address deficits following CTR RUE and then to also include CTR LUE when appropriate.   PERFORMANCE DEFICITS: in functional skills including ADLs, IADLs, coordination, proprioception, sensation, edema, ROM, strength, pain, Fine motor control, decreased knowledge of precautions, decreased knowledge of use of DME, skin integrity, and UE functional use.   IMPAIRMENTS: are limiting patient from ADLs, IADLs, rest and sleep, work, leisure, and social participation.   COMORBIDITIES: may have co-morbidities  that affects occupational performance. Patient will benefit from skilled OT to address above impairments and improve overall function.  MODIFICATION OR ASSISTANCE TO COMPLETE EVALUATION: No modification of tasks or assist necessary to complete an evaluation.  OT OCCUPATIONAL PROFILE AND HISTORY: Detailed assessment: Review of records and additional review of physical, cognitive, psychosocial history related to current functional performance.  CLINICAL DECISION MAKING: Moderate - several treatment options, min-mod task modification necessary  REHAB POTENTIAL: Good  EVALUATION  COMPLEXITY: Moderate      PLAN:  OT FREQUENCY: 1-2x/week  OT DURATION: 8 weeks  PLANNED INTERVENTIONS: 97535 self care/ADL training, 02889 therapeutic exercise, 97530 therapeutic activity, 97140 manual therapy, 97035 ultrasound, 97018 paraffin, 02960 fluidotherapy, 97010 moist heat, 97010 cryotherapy, 97034 contrast bath, 97760 Orthotic Initial, 97763 Orthotic/Prosthetic subsequent, scar mobilization, passive range of motion, compression bandaging, patient/family education, and DME and/or AE instructions  RECOMMENDED OTHER SERVICES: none at this time  CONSULTED AND AGREED WITH PLAN OF CARE: Patient  PLAN FOR NEXT SESSION: Pt to return 7/15 to work on RUE - fluidotherapy, continued light strengthening. Pt scheduled for surgery for LT CTR on 02/21/24 and will begin addressing LUE the following 1-2 weeks post-op per protocol.    Burnard JINNY Roads, OT 02/20/2024, 8:51 AM

## 2024-02-20 NOTE — Patient Instructions (Signed)
 1. Grip Strengthening (Resistive Putty)   Squeeze putty using thumb and all fingers. Repeat _20___ times. Do __2__ sessions per day.   2. Roll putty into tube on table and pinch between first two fingers and thumb x 10 reps. Do 2 sessions per day   3. MP Flexion (Resistive Putty)    Bending only at large knuckles, press putty down against thumb. Keep fingertips straight. Repeat _10___ times. Do __2__ sessions per day.   4. Finger Extension / Thumb Abduction: Resisted    With rubber band around right thumb and ___all_____ fingers, hand slightly cupped, gently spread thumb and fingers apart. Repeat __10__ times per set.  Do __2__ sessions per day.

## 2024-02-21 DIAGNOSIS — G5602 Carpal tunnel syndrome, left upper limb: Secondary | ICD-10-CM | POA: Diagnosis not present

## 2024-02-24 ENCOUNTER — Ambulatory Visit
Admission: RE | Admit: 2024-02-24 | Discharge: 2024-02-24 | Disposition: A | Discharge status: Home / Self Care | Attending: Physician Assistant

## 2024-02-24 ENCOUNTER — Other Ambulatory Visit: Payer: Self-pay

## 2024-02-24 VITALS — BP 135/89 | HR 84 | Temp 98.2°F | Resp 18 | Ht 64.0 in | Wt 190.0 lb

## 2024-02-24 DIAGNOSIS — H68003 Unspecified Eustachian salpingitis, bilateral: Secondary | ICD-10-CM | POA: Diagnosis not present

## 2024-02-24 DIAGNOSIS — R42 Dizziness and giddiness: Secondary | ICD-10-CM

## 2024-02-24 DIAGNOSIS — R110 Nausea: Secondary | ICD-10-CM | POA: Diagnosis not present

## 2024-02-24 MED ORDER — ONDANSETRON 4 MG PO TBDP: 4.0000 mg | ORAL_TABLET | Freq: Three times a day (TID) | ORAL | 0 refills | Status: DC | PRN

## 2024-02-24 MED ORDER — MECLIZINE HCL 25 MG PO TABS: 25.0000 mg | ORAL_TABLET | Freq: Three times a day (TID) | ORAL | 0 refills | Status: DC | PRN

## 2024-02-24 NOTE — Discharge Instructions (Addendum)
 I also recommend adding an antihistamine to your daily regimen This includes medications like Claritin, Allegra, Zyrtec- the generics of these work very well and are usually less expensive I recommend using Flonase nasal spray - 2 puffs twice per day to help with your nasal congestion The antihistamines and Flonase can take a few weeks to provide significant relief from inner ear symptoms but should start to provide some benefit soon.    I have also sent in a medication called Zofran  to assist with your nausea and vomiting.  You can take this as needed up to every 8 hours.  Please be advised that the most common side effect of this medication is constipation.  If you start to develop the symptoms I recommend taking a few doses of a stool softener and increasing your fiber.  If needed you can also take a few doses of MiraLAX until you have regular bowel movements again.

## 2024-02-24 NOTE — ED Triage Notes (Signed)
 Pt presents with a chief complaint of left ear fullness x 3 days. States the fullness is accompanied with feeling woozy and nausea. Currently denies pain. Meloxicam  taken with no improvement.

## 2024-02-24 NOTE — ED Provider Notes (Signed)
 [facility]    CSN: [id number] Arrival date & time: 02/24/24  1117      History   Chief Complaint Chief Complaint  Patient presents with   Hearing Problem    The sound in my left ear has been very muffled since Friday. Also mild nausea and headache. - Entered by patient    HPI Cassandra Hughes is a 53 y.o. female.   HPI  Pt reports concerns for ear fullness and dizziness She state this is predominantly on the left and is also associated with decreased hearing  She reports that it seems to be causing some dizziness and nausea. She also reports some sinus pressure    Past Medical History:  Diagnosis Date   Migraines    Thyroid  disease     Patient Active Problem List   Diagnosis Date Noted   Migraine without aura and with status migrainosus, not intractable 03/08/2023   Vitamin D  deficiency 03/08/2023   Hyperlipidemia 03/08/2023   Acquired hypothyroidism 03/08/2023   Intramural leiomyoma of uterus 02/12/2017   DUB (dysfunctional uterine bleeding) 02/12/2017   Family history of cancer of gallbladder 10/15/2015    Past Surgical History:  Procedure Laterality Date   CESAREAN SECTION     CHOLECYSTECTOMY, LAPAROSCOPIC  01/2016   CYST REMOVAL NECK     PELVIC LAPAROSCOPY     fibroid    SKIN CANCER EXCISION N/A    TONSILLECTOMY AND ADENOIDECTOMY      OB History     Gravida  1   Para  1   Term      Preterm      AB      Living  1      SAB      IAB      Ectopic      Multiple      Live Births               Home Medications    Prior to Admission medications   Medication Sig Start Date End Date Taking? Authorizing Provider  meclizine  (ANTIVERT ) 25 MG tablet Take 1 tablet (25 mg total) by mouth 3 (three) times daily as needed for dizziness. 02/24/24  Yes Amayra Kiedrowski E, PA-C  ondansetron  (ZOFRAN -ODT) 4 MG disintegrating tablet Take 1 tablet (4 mg total) by mouth every 8 (eight) hours as needed for nausea or vomiting. 02/24/24  Yes  Jaber Dunlow E, PA-C  acetaminophen -codeine  (TYLENOL  #3) 300-30 MG tablet Take 1 tablet by mouth every 6 (six) hours as needed. 02/20/24   Agarwala, Anshul, MD  Cholecalciferol (VITAMIN D3) 1.25 MG (50000 UT) CAPS Take 1 capsule (1.25 mg total) by mouth once a week. 03/09/23   Billy Knee, FNP  meloxicam  (MOBIC ) 15 MG tablet Take 1 tablet (15 mg total) by mouth daily. 12/26/23   Billy Knee, FNP  rizatriptan  (MAXALT ) 10 MG tablet TAKE 1 TABLET BY MOUTH AT ONSET OF MIGRAINE; MAY REPEAT 1 TABLET IN 2 HOURS IF NEEDED. 02/11/24   Billy Knee, FNP  thyroid  (ARMOUR) 120 MG tablet as directed.    [provider]  thyroid  (ARMOUR) 30 MG tablet as directed.    [provider]    Family History Family History  Problem Relation Age of Onset   Cancer Mother        gallbladder    Cancer Father        prostate   Cancer Maternal Grandmother        Gallbladder cancer  Heart disease Sister    Migraines Sister    Breast cancer Neg Hx     Social History Social History   Tobacco Use   Smoking status: Former    Types: Cigarettes   Smokeless tobacco: Never  Vaping Use   Vaping status: Never Used  Substance Use Topics   Alcohol use: Yes    Comment: rarely   Drug use: No     Allergies   Vicodin [hydrocodone-acetaminophen ]   Review of Systems Review of Systems  Constitutional:  Negative for chills and fever.  HENT:  Negative for ear discharge and ear pain.        Left ear fullness   Gastrointestinal:  Positive for nausea.  Neurological:  Positive for dizziness.     Physical Exam Triage Vital Signs ED Triage Vitals  Encounter Vitals Group     BP 02/24/24 1131 135/89     Girls Systolic BP Percentile --      Girls Diastolic BP Percentile --      Boys Systolic BP Percentile --      Boys Diastolic BP Percentile --      Pulse Rate 02/24/24 1131 84     Resp 02/24/24 1131 18     Temp 02/24/24 1131 98.2 F (36.8 C)     Temp Source 02/24/24 1131 Oral     SpO2  02/24/24 1131 97 %     Weight 02/24/24 1133 190 lb (86.2 kg)     Height 02/24/24 1133 5' 4 (1.626 m)     Head Circumference --      Peak Flow --      Pain Score 02/24/24 1133 0     Pain Loc --      Pain Education --      Exclude from Growth Chart --    No data found.  Updated Vital Signs BP 135/89 (BP Location: Right Arm)   Pulse 84   Temp 98.2 F (36.8 C) (Oral)   Resp 18   Ht 5' 4 (1.626 m)   Wt 190 lb (86.2 kg)   SpO2 97%   BMI 32.61 kg/m   Visual Acuity Right Eye Distance:   Left Eye Distance:   Bilateral Distance:    Right Eye Near:   Left Eye Near:    Bilateral Near:     Physical Exam Vitals reviewed.  Constitutional:      General: She is awake. She is not in acute distress.    Appearance: Normal appearance. She is well-developed and well-groomed. She is not ill-appearing or toxic-appearing.  HENT:     Head: Normocephalic and atraumatic.     Right Ear: Hearing, tympanic membrane and ear canal normal.     Left Ear: Hearing, tympanic membrane and ear canal normal.     Nose: Nose normal.     Mouth/Throat:     Lips: Pink.     Mouth: Mucous membranes are moist.     Pharynx: Oropharynx is clear. Uvula midline. No pharyngeal swelling, oropharyngeal exudate, posterior oropharyngeal erythema, uvula swelling or postnasal drip.     Tonsils: No tonsillar exudate or tonsillar abscesses.  Eyes:     General: Lids are normal. Gaze aligned appropriately.     Extraocular Movements: Extraocular movements intact.     Conjunctiva/sclera: Conjunctivae normal.  Lymphadenopathy:     Head:     Right side of head: No submental, submandibular or preauricular adenopathy.     Left side of head: No submental, submandibular or preauricular  adenopathy.     Cervical:     Right cervical: No superficial cervical adenopathy.    Left cervical: No superficial cervical adenopathy.     Upper Body:     Right upper body: No supraclavicular adenopathy.     Left upper body: No  supraclavicular adenopathy.  Neurological:     Mental Status: She is alert and oriented to person, place, and time.  Psychiatric:        Behavior: Behavior is cooperative.      UC Treatments / Results  Labs (all labs ordered are listed, but only abnormal results are displayed) Labs Reviewed - No data to display  EKG   Radiology No results found.  Procedures Procedures (including critical care time)  Medications Ordered in UC Medications - No data to display  Initial Impression / Assessment and Plan / UC Course  I have reviewed the triage vital signs and the nursing notes.  Pertinent labs & imaging results that were available during my care of the patient were reviewed by me and considered in my medical decision making (see chart for details).      Final Clinical Impressions(s) / UC Diagnoses   Final diagnoses:  Eustachian catarrh, bilateral  Dizziness and giddiness  Nausea without vomiting   Patient presents today with concerns for left ear fullness associated with mild dizziness and nausea has been ongoing for the past 3 days.  She denies ear pain, drainage, fever or chills.  Physical exam is notable for mild middle ear effusion without significant bulging or erythema of the tympanic membrane.  At this time suspect eustachian tube dysfunction and recommend starting a second-generation antihistamine as well as Flonase for further relief.  To assist with dizziness will send meclizine  25 mg p.o. 3 times daily as needed.  To assist with nausea will send Zofran  4 mg ODT p.o. 3 times daily as needed.  Side effects of medications were reviewed with patient.  Follow-up precautions reviewed as well.  Recommend follow-up with PCP if symptoms are not improving or seem to be worsening    Discharge Instructions      I also recommend adding an antihistamine to your daily regimen This includes medications like Claritin, Allegra, Zyrtec- the generics of these work very well and are  usually less expensive I recommend using Flonase nasal spray - 2 puffs twice per day to help with your nasal congestion The antihistamines and Flonase can take a few weeks to provide significant relief from inner ear symptoms but should start to provide some benefit soon.    I have also sent in a medication called Zofran  to assist with your nausea and vomiting.  You can take this as needed up to every 8 hours.  Please be advised that the most common side effect of this medication is constipation.  If you start to develop the symptoms I recommend taking a few doses of a stool softener and increasing your fiber.  If needed you can also take a few doses of MiraLAX until you have regular bowel movements again.      ED Prescriptions     Medication Sig Dispense Auth. Provider   meclizine  (ANTIVERT ) 25 MG tablet Take 1 tablet (25 mg total) by mouth 3 (three) times daily as needed for dizziness. 30 tablet Thailan Sava E, PA-C   ondansetron  (ZOFRAN -ODT) 4 MG disintegrating tablet Take 1 tablet (4 mg total) by mouth every 8 (eight) hours as needed for nausea or vomiting. 20 tablet Jennavieve Arrick E,  PA-C      PDMP not reviewed this encounter.   Marylene Rocky BRAVO, PA-C 02/24/24 1658

## 2024-02-26 ENCOUNTER — Encounter: Payer: Self-pay | Admitting: Occupational Therapy

## 2024-02-26 ENCOUNTER — Ambulatory Visit: Payer: Self-pay | Admitting: Occupational Therapy

## 2024-02-26 DIAGNOSIS — M79642 Pain in left hand: Secondary | ICD-10-CM | POA: Diagnosis not present

## 2024-02-26 DIAGNOSIS — M79641 Pain in right hand: Secondary | ICD-10-CM | POA: Diagnosis not present

## 2024-02-26 DIAGNOSIS — R208 Other disturbances of skin sensation: Secondary | ICD-10-CM

## 2024-02-26 DIAGNOSIS — M6281 Muscle weakness (generalized): Secondary | ICD-10-CM | POA: Diagnosis not present

## 2024-02-26 DIAGNOSIS — G5603 Carpal tunnel syndrome, bilateral upper limbs: Secondary | ICD-10-CM | POA: Diagnosis not present

## 2024-02-26 DIAGNOSIS — R600 Localized edema: Secondary | ICD-10-CM | POA: Diagnosis not present

## 2024-02-26 NOTE — Therapy (Signed)
 OUTPATIENT OCCUPATIONAL THERAPY ORTHO TREATMENT  Patient Name: Cassandra Hughes MRN: [id number] DOB:Jan 28, 1971, 53 y.o., female Today's Date: 02/26/2024  PCP: Billy Knee, FNP REFERRING PROVIDER: Arlinda Buster, MD  END OF SESSION:  OT End of Session - 02/26/24 1240     Visit Number 3    Number of Visits 12    Date for OT Re-Evaluation 04/14/24    Authorization Type [employer] MCD - NO auth required, 27 visits    OT Start Time 1235    OT Stop Time 1315    OT Time Calculation (min) 40 min    Activity Tolerance Patient tolerated treatment well    Behavior During Therapy WFL for tasks assessed/performed          Past Medical History:  Diagnosis Date   Migraines    Thyroid  disease    Past Surgical History:  Procedure Laterality Date   CESAREAN SECTION     CHOLECYSTECTOMY, LAPAROSCOPIC  01/2016   CYST REMOVAL NECK     PELVIC LAPAROSCOPY     fibroid    SKIN CANCER EXCISION N/A    TONSILLECTOMY AND ADENOIDECTOMY     Patient Active Problem List   Diagnosis Date Noted   Migraine without aura and with status migrainosus, not intractable 03/08/2023   Vitamin D  deficiency 03/08/2023   Hyperlipidemia 03/08/2023   Acquired hypothyroidism 03/08/2023   Intramural leiomyoma of uterus 02/12/2017   DUB (dysfunctional uterine bleeding) 02/12/2017   Family history of cancer of gallbladder 10/15/2015    ONSET DATE: 02/11/2024  REFERRING DIAG: G56.02 (ICD-10-CM) - Carpal tunnel syndrome, left upper limbG56.03 (ICD-10-CM) - Bilateral carpal tunnel syndrome  Note: Right  OPEN CARPAL TUNNEL RELEASE, 64721 S/p Rt carpal tunnel release 01/24/24  THERAPY DIAG:  Muscle weakness (generalized)  Other disturbances of skin sensation  Localized edema  Pain in right hand  Pain in left hand  Rationale for Evaluation and Treatment: Rehabilitation  SUBJECTIVE:   SUBJECTIVE STATEMENT: I had surgery on my Lt hand for CTR last Thursday (02/21/24)  Rt hand pain generally 1-2/10. Lt  hand pain generally 1-3/10  Pt accompanied by: self  PERTINENT HISTORY: Migraines, thyroid  disease  PRECAUTIONS: Other: s/p Rt CTR 01/24/24 - No strengthening yet until 4 weeks post-op. S/p LT CTR 02/21/24 - No strengthening for 4 weeks post op   WEIGHT BEARING RESTRICTIONS: limited per protocol  PAIN:  Are you having pain? None at time of eval but occasional nerve pain  FALLS: Has patient fallen in last 6 months? No  LIVING ENVIRONMENT: Lives with: lives with their family (7 y.o. 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  HAND DOMINANCE: Right  ADLs: Overall ADLs: mod I for BADLs, doing most IADLS w/ son to assist prn for heavier lifting  FUNCTIONAL OUTCOME MEASURES: Quick Dash: 16% RUE Deficit  UPPER EXTREMITY ROM:   BUE AROM WNLS   HAND FUNCTION: Unable to assess grip strength d/t current precautions  COORDINATION: 9 Hole Peg test: Right: 18 sec; Left: 19 sec  SENSATION: Still reports numbness tip of long finger and radial side of ring finger. Thumb sensation improved and pain improved since surgery  EDEMA: mild   COGNITION: Overall cognitive status: Within functional limits for tasks assessed   OBSERVATIONS: stitches removed, incision healing nicely   TREATMENT DATE: 02/26/24  Pt now only 5 days post-op Lt CTR w/ stitches and large bandaid over incision. No treatment initiated this date for Lt hand  Below treatment is for Rt hand:                                                                                                                             Fluidotherapy x 10 minutes for Rt hand to address  pain, swelling, and stiffness. No adverse reactions.    Ultrasound x 8 minutes, 3 Mhz, 0.8 wts/cm2, 20% pulsed over scar incision Rt hand.   Pt did not bring back putty HEP for review but verbally reviewed - no further questions.   Grip strength Rt = 46.7 lbs Gripper set at level 2 resistance to pick up blocks Rt hand for sustained grip strength. Pt instructed to rest and stretch hand 1/2 way through     PATIENT EDUCATION: Education details: see above Person educated: Patient Education method: Programmer, multimedia, Demonstration, Verbal cues, and Handouts Education comprehension: verbalized understanding, returned demonstration, and verbal cues required  HOME EXERCISE PROGRAM: 02/12/24: CTR home program, median nerve gliding ex's, tendon gliding ex's, scar massage (for RUE)  02/20/24: putty HEP (for RUE)   GOALS: Goals reviewed with patient? Yes    SHORT TERM GOALS: Target date: 03/14/24   Patient will demonstrate initial RUE HEP with 25% verbal cues or less for proper execution. Baseline: New to outpt OT Goal status: MET      2.  Pt will independently recall at least 3 joint protection, ergonomics, and body mechanic principles as noted in pt instructions to assist with daily tasks with increased comfort and confidence.  Baseline:  New to outpt OT Goal status: IN PROGRESS   3.  Patient will demonstrate at least 25 lbs grip strength Rt hand to open jars and other containers. Baseline: unable to assess d/t current precautions  Goal status: MET (02/26/24: 46.7 LBS)    5.  Pt will improve Quick Dash for RUE as evidenced by 5% deficit decrease  Baseline: 16% Goal status: INITIAL     LONG TERM GOALS: Target date: 04/14/24   Patient will demonstrate updated RUE and LUE HEP with visual instruction only for proper execution. Baseline: unable to progress d/t current precautions Goal status: INITIAL    2.  Pt will return to using Rt hand as dominant hand for all ADLS, and Lt hand as assist   Baseline:  New to outpt OT Goal status: INITIAL   3.  Pt will be independent with home based pain management routine to potentially include gloves/splints, heat and joint protection principles for minimal pain. Baseline: not yet addressed Goal status: INITIAL   4.  Patient will be assessed for Quick Dash on LUE following Lt CTR and then show 10% deficit reduction by  discharge  Baseline: TBA Goal status: INITIAL  5. Pt will demo 35 lbs or greater grip strength on Rt dominant hand and 20 lbs or greater grip strength on Lt hand   Baseline: unable to assess d/t current precautions  Goal status: INITIAL      ASSESSMENT:  CLINICAL IMPRESSION: Patient now > 4 weeks post-op Rt CTR, however only 5 days post-op LT CTR. Pt progressing with protocol for RUE as indicated. Will wait to begin ROM, tendon gliding and median n gliding for LUE until 2 weeks post-op (03/06/24 or after) per protocol unless instructed to begin sooner by hand surgeon.   PERFORMANCE DEFICITS: in functional skills including ADLs, IADLs, coordination, proprioception, sensation, edema, ROM, strength, pain, Fine motor control, decreased knowledge of precautions, decreased knowledge of use of DME, skin integrity, and UE functional use.   IMPAIRMENTS: are limiting patient from ADLs, IADLs, rest and sleep, work, leisure, and social participation.   COMORBIDITIES: may have co-morbidities  that affects occupational performance. Patient will benefit from skilled OT to address above impairments and improve overall function.  MODIFICATION OR ASSISTANCE TO COMPLETE EVALUATION: No modification of tasks or assist necessary to complete an evaluation.  OT OCCUPATIONAL PROFILE AND HISTORY: Detailed assessment: Review of records and additional review of physical, cognitive, psychosocial history related to current functional performance.  CLINICAL DECISION MAKING: Moderate - several treatment options, min-mod task modification  necessary  REHAB POTENTIAL: Good  EVALUATION COMPLEXITY: Moderate      PLAN:  OT FREQUENCY: 1-2x/week  OT DURATION: 8 weeks  PLANNED INTERVENTIONS: 97535 self care/ADL training, 02889 therapeutic exercise, 97530 therapeutic activity, 97140 manual therapy, 97035 ultrasound, 97018 paraffin, 02960 fluidotherapy, 97010 moist heat, 97010 cryotherapy, 97034 contrast bath, 97760 Orthotic Initial, 97763 Orthotic/Prosthetic subsequent, scar mobilization, passive range of motion, compression bandaging, patient/family education, and DME and/or AE instructions  RECOMMENDED OTHER SERVICES: none at this time  CONSULTED AND AGREED WITH PLAN OF CARE: Patient  PLAN FOR NEXT SESSION: assess Quick Dash for RUE, begin protocol for LUE on 03/06/24 or after unless directed sooner by hand surgeon   Burnard JINNY Roads, OT 02/26/2024, 12:41 PM

## 2024-03-06 ENCOUNTER — Ambulatory Visit: Admitting: Orthopedic Surgery

## 2024-03-06 DIAGNOSIS — Z9889 Other specified postprocedural states: Secondary | ICD-10-CM

## 2024-03-06 NOTE — Progress Notes (Unsigned)
   Cassandra Hughes - 53 y.o. female MRN [id number]  Date of birth: [birth year]  Office Visit Note: Visit Date: 03/06/2024 PCP: Billy Knee, FNP Referred by: Billy Knee, FNP  Subjective:  HPI: Cassandra Hughes is a 53 y.o. female who presents today for follow up 2 week status post left open carpal tunnel release.  Pertinent ROS were reviewed with the patient and found to be negative unless otherwise specified above in HPI.   Assessment & Plan: Visit Diagnoses: No diagnosis found.  Plan: ***  Follow-up: No follow-ups on file.   Meds & Orders: No orders of the defined types were placed in this encounter.  No orders of the defined types were placed in this encounter.    Procedures: No procedures performed       Objective:   Vital Signs: There were no vitals taken for this visit.  Ortho Exam ***  Imaging: No results found.   Cassandra Hughes, M.D. [facility], Hand Surgery

## 2024-03-10 ENCOUNTER — Ambulatory Visit: Payer: Self-pay | Admitting: Occupational Therapy

## 2024-03-10 DIAGNOSIS — M79641 Pain in right hand: Secondary | ICD-10-CM

## 2024-03-10 DIAGNOSIS — M79642 Pain in left hand: Secondary | ICD-10-CM | POA: Diagnosis not present

## 2024-03-10 DIAGNOSIS — G5603 Carpal tunnel syndrome, bilateral upper limbs: Secondary | ICD-10-CM | POA: Diagnosis not present

## 2024-03-10 DIAGNOSIS — R208 Other disturbances of skin sensation: Secondary | ICD-10-CM

## 2024-03-10 DIAGNOSIS — R600 Localized edema: Secondary | ICD-10-CM

## 2024-03-10 DIAGNOSIS — M6281 Muscle weakness (generalized): Secondary | ICD-10-CM | POA: Diagnosis not present

## 2024-03-10 NOTE — Patient Instructions (Signed)
You can place your arms out to the sides with pillows underneath or place a pillow across your stomach with your hands resting on top for support.   Hold a pillow with your arms away from your body.  Do not bend your arms at the elbows or tuck your hands under your head. Do not place your hand on top or underneath pillow. Use outside of pillow as a border.        [id number] (8/07) - Page 1 of 6 [facility]  Rehabilitation and Sports Medicine Services Joint Protection Principles Therapist Phone  Joint protection principles are a series of techniques which can be included into all activities. This will reduce the stress on your joints. Joints that have been weakened by arthritis are at risk of being damaged by stress and strain. Improper use of diseased joints may lead to impaired function and deformity. Joint protection techniques are ways of doing activities so that the risk of deformity is decreased. 1. Respect For Pain Stop activities before you reach the point of discomfort or pain. Limit activities which cause your pain to last more than one hour after you have stopped the activity. 2. Balance Activity And Rest Rest before becoming tired. Plan rest periods during longer or more difficult activities. By resting 10 minutes during an activity, you will have more energy to continue. 3. Avoid Activities Which Cannot Be Stopped When you begin to feel joint pain, stop. This will eliminate excessive pain and fatigue later. Prioritize activities. Consider the activity, length of time, and difficulty before beginning. Plan difficult activities for "peak" energy times. 4. Use Larger, Stronger Joints For Activities, When Possible, Distributing The Weight Over Non-involved Or Stronger Joints.  To lift a bag from a counter, bend your knees, hug the bag with both arms. Bend your elbows so that the bag is held tightly to your chest and straighten your knees. Keep hold on the bag by keeping your elbows  bent. If the load is too heavy, push shopping cart, or get help with groceries - use drive-up service.  You can use your hip to push open doors, and your feet to close lower drawers. OVER  [id number] (8/07) - Page 2 of 6  An envelope briefcase with a snap lock can be used rather than an attache case. By bending the elbows, the case can be carried under the arm so that the case rests on the forearm. Hold the case by resting your arm against your body. Switch the case from one side of the body to the other.  Use the larger joints (elbow or shoulder) to carry the weight of the purse.  Wrong: The weight of the purse is all on the weak fingers.  Wing faucets: Keeping wrists extended, use the palm of your left hand to turn on a left faucet and the back of your left hand to turn the faucet off.  Four-pronged or circular faucets: Place palm of hand on top of the faucet keeping fingers extended. Straighten your elbow and apply a downward force on faucet, pushing from your shoulder. Keeping fingers and elbow extended, turn your arm inward toward your thumb. Right: The stronger elbow should carry the weight of the purse. CONTINUED ON NEXT PAGE [id number] (8/07) - Page 3 of 6  Wrong: Do not use fingers to lift heavy roasting pans or dishes.  Wrong: All the weight of the pot would on your weak fingers. 5. Avoid Staying In One Position For Extended Periods Of Time.  Plan rest periods. Change your position. Stretch and relax your joints. 6. Maintain Or Use Your Joints In Good Alignment. Maintain proper posture.  This is good alignment. Right: Use oven mitts and lift with palms, using the stronger wrists and elbows to do the work. Right: Pick up the pot with two hands, using your palms. Avoid or change activities that cause your fingers to move towards the little finger side of your hand. OVER [id number] (8/07) - Page 4 of 6  Use the palms of your hands for lifting and pushing. Push instead of pulling. 7.  Maintain Proper Weight. Additional weight can stress weight-bearing joints (hip, knees, feet, back). Special Considerations For The Hands  1. AVOID TIGHT GRASP. Use a relaxed grip. Enlarge handles.  Place palm of hand on jar lid, and using weight if body, turn arm at shoulder to open jar. A sponge or wet towel under the jar prevents sliding.  2. AVOID PRESSURE ON BACK OF KNUCKLES (MP JOINTS).  Wrong Right When rising from chair or bed. Dishwashing should be done with fingers kept straight as much as possible. It a dishwasher is available, use it in preference to washing by hand. Hold the knife or mixing spoon like a dagger, with the handle parallel to knuckles. Cutting is then changed from sawing to pulling. CONTINUED ON NEXT PAGE [id number] (8/07) - Page 5 of 6  3. USE BOTH HANDS WHEN POSSIBLE  4. AVOID REPETITIVE HAND ACTIVITIES Take breaks Change activity, i.e. using screwdriver, crocheting.  5. AVOID PRESSURE TO TIP OF THUMB Example: pushing snaps together, opening car doors, ringing doorbells.  To protect thumb joints, open milk containers with heels of the hands rather than thumbs. PostureWhether walking, standing, sitting or even sleeping, good posture is important for people with arthritis. Poor posture can make arthritis worse. As for standing, you should stand straight, head high, shoulders back, stomach in, and hips and knees straight. WalkingWalk erect, as in standing position. Arm swing freely at sides; let your weight shift easily from side to the other. Don't carry heavy packages in one hand. A lightweight shoulder bag is a good idea. If legs or knees are involved, a cane will make walking easier. Ring top cans: Hold the can with one hand. With the other hand, place a knife through the ring with handle of knife directly over the opening. Using the palm of your hand, push down on the handle of the knife. OVER [id number] (8/07) - Page 6 of 6  Resting/SleepingPatients with rheumatoid  arthritis should avoid bent knees or arms. Lie straight at sides, knees and hips straight. Use a firm mattress or put plywood board between mattress and bedspring. If you need a pillow under your head, use a thin one. Keep sheets and blankets loose over your feet, perhaps by using a blanket support. If your arthritis is in your back, you may need a different position for sleeping. Ask your doctor. SittingKeep good posture when sitting down. Use straight-back armchairs with firm seats. Sit with head up, shoulders back, stomach in, feet flat on floor. Use arms of chair to stand up slowly. References AOTA's, Workbook for Consumers with R.A.   Cordery, Vanita Panda, M.A.O.T., OTR, "Joint Protection - A Responsibility for the Occupational Therapist," American Journal of Occupational Therapy, XIX, %, 1965.   "Joint Protection," Occupational Therapy Department, [facility] [place], [place], [place].   Alesia Morin: Rheumatic Disease Occupational Therapy and Rehab, [id number].   "Principles of Joint Protection,"  Occupational Therapy Department, [facility], [place], [place].   [employer], 5621.   Sammuel Cooper, OTR, and Tonye Pearson, OTR, "Joint Preservation Techniques for Patients with Rheumatoid Arthritis," Department of Occupational Therapy, [facility], [place], [place].

## 2024-03-10 NOTE — Therapy (Addendum)
 OUTPATIENT OCCUPATIONAL THERAPY ORTHO TREATMENT  Patient Name: Cassandra Hughes MRN: [id number] DOB:22-May-1971, 53 y.o., female Today's Date: 03/10/2024  PCP: Billy Knee, FNP REFERRING PROVIDER: Arlinda Buster, MD  END OF SESSION:  OT End of Session - 03/10/24 1451     Visit Number 4    Number of Visits 12    Date for OT Re-Evaluation 04/14/24    Authorization Type [employer] MCD - NO auth required, 27 visits    OT Start Time 1450    OT Stop Time 1533    OT Time Calculation (min) 43 min    Activity Tolerance Patient tolerated treatment well    Behavior During Therapy WFL for tasks assessed/performed          Past Medical History:  Diagnosis Date   Migraines    Thyroid  disease    Past Surgical History:  Procedure Laterality Date   CESAREAN SECTION     CHOLECYSTECTOMY, LAPAROSCOPIC  01/2016   CYST REMOVAL NECK     PELVIC LAPAROSCOPY     fibroid    SKIN CANCER EXCISION N/A    TONSILLECTOMY AND ADENOIDECTOMY     Patient Active Problem List   Diagnosis Date Noted   Migraine without aura and with status migrainosus, not intractable 03/08/2023   Vitamin D  deficiency 03/08/2023   Hyperlipidemia 03/08/2023   Acquired hypothyroidism 03/08/2023   Intramural leiomyoma of uterus 02/12/2017   DUB (dysfunctional uterine bleeding) 02/12/2017   Family history of cancer of gallbladder 10/15/2015    ONSET DATE: 02/11/2024  REFERRING DIAG: G56.02 (ICD-10-CM) - Carpal tunnel syndrome, left upper limbG56.03 (ICD-10-CM) - Bilateral carpal tunnel syndrome  Note: Right  OPEN CARPAL TUNNEL RELEASE, 64721 S/p Rt carpal tunnel release 01/24/24  THERAPY DIAG:  Muscle weakness (generalized)  Other disturbances of skin sensation  Pain in right hand  Localized edema  Pain in left hand  Rationale for Evaluation and Treatment: Rehabilitation  SUBJECTIVE:   SUBJECTIVE STATEMENT: Pt reports stitches were removed last Thursday. No issues.   Pt accompanied by:  self  PERTINENT HISTORY: Migraines, thyroid  disease  PRECAUTIONS: Other: s/p Rt CTR 01/24/24 - No strengthening yet until 4 weeks post-op. S/p LT CTR 02/21/24 - No strengthening for 4 weeks post op   WEIGHT BEARING RESTRICTIONS: limited per protocol  PAIN:  Are you having pain? None at time of eval but occasional nerve pain  FALLS: Has patient fallen in last 6 months? No  LIVING ENVIRONMENT: Lives with: lives with their family (1 y.o. 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  HAND DOMINANCE: Right  ADLs: Overall ADLs: mod I for BADLs, doing most IADLS w/ son to assist prn for heavier lifting  FUNCTIONAL OUTCOME MEASURES: Quick Dash: 16% RUE Deficit 03/10/2024: 13.6 % deficit with BUE  UPPER EXTREMITY ROM:   BUE AROM WNLS  HAND FUNCTION: Unable to assess grip strength d/t current precautions  COORDINATION: 9 Hole Peg test: Right: 18 sec; Left: 19 sec  SENSATION: Still reports numbness tip of long finger and radial side of ring finger. Thumb sensation improved and pain improved since surgery  EDEMA: mild   COGNITION: Overall cognitive status:  Within functional limits for tasks assessed   OBSERVATIONS: stitches removed, incision healing nicely   TREATMENT:  Objective measures assessed as noted in Goals section to determine progression towards goals.  OT educated pt on joint protection, ergonomics, and Optometrist principles as noted in pt instructions as needed to improve UE pain.    PATIENT EDUCATION: Education details: see above Person educated: Patient Education method: Programmer, multimedia, Demonstration, Verbal  cues, and Handouts Education comprehension: verbalized understanding, returned demonstration, and verbal cues required  HOME EXERCISE PROGRAM: 02/12/24: CTR home program, median nerve gliding ex's, tendon gliding ex's, scar massage (for RUE)  02/20/24: putty HEP (for RUE)  03/10/2024:  joint protection, ergonomics, and Optometrist principles  GOALS: Goals reviewed with patient? Yes    SHORT TERM GOALS: Target date: 03/14/24   Patient will demonstrate initial RUE HEP with 25% verbal cues or less for proper execution. Baseline: New to outpt OT Goal status: MET     2.  Pt will independently recall at least 3 joint protection, ergonomics, and body mechanic principles as noted in pt instructions to assist with daily tasks with increased comfort and confidence.  Baseline:  New to outpt OT Goal status: IN PROGRESS   3.  Patient will demonstrate at least 25 lbs grip strength Rt hand to open jars and other containers. Baseline: unable to assess d/t current precautions  Goal status: MET (02/26/24: 46.7 LBS)    5.  Pt will improve Quick Dash for RUE as evidenced by 5% deficit decrease  Baseline: 16% 03/10/2024: 13.6% Goal status: IN PROGRESS     LONG TERM GOALS: Target date: 04/14/24   Patient will demonstrate updated RUE and LUE HEP with visual instruction only for proper execution. Baseline: unable to progress d/t current precautions Goal status: INITIAL    2.  Pt will return to using Rt hand as dominant hand for all ADLS, and Lt hand as assist  Baseline:  New to outpt OT Goal status: INITIAL   3.  Pt will be independent with home based pain management routine to potentially include gloves/splints, heat and joint protection principles for minimal pain. Baseline: not yet addressed Goal status: IN PROGRESS   4.  Patient will be assessed for Quick Dash on LUE following Lt CTR and then show 10% deficit reduction by discharge  Baseline: TBA Goal status: INITIAL  5. Pt will demo 35 lbs or  greater grip strength on Rt dominant hand and 20 lbs or greater grip strength on Lt hand   Baseline: unable to assess d/t current precautions  Goal status: INITIAL   ASSESSMENT:  CLINICAL IMPRESSION: Pt progressing well towards goals. Good understanding of joint protection and tendon glides. Will continue to progress per protocol.   PERFORMANCE DEFICITS: in functional skills including ADLs, IADLs, coordination, proprioception, sensation, edema, ROM, strength, pain, Fine motor control, decreased knowledge of precautions, decreased knowledge of use of DME, skin integrity, and UE functional use.   IMPAIRMENTS: are limiting patient from ADLs, IADLs, rest and sleep, work, leisure, and social participation.   COMORBIDITIES: may have co-morbidities  that affects occupational performance. Patient will benefit from skilled OT to address above impairments and improve overall function.  REHAB POTENTIAL: Good  PLAN:  OT FREQUENCY: 1-2x/week  OT DURATION: 8 weeks  PLANNED INTERVENTIONS: 97535 self care/ADL training, 02889 therapeutic exercise, 97530 therapeutic activity, 97140 manual therapy, 97035 ultrasound, 97018 paraffin, 02960 fluidotherapy, 97010 moist heat, 97010 cryotherapy, 97034 contrast bath, 97760 Orthotic Initial, [id number] Orthotic/Prosthetic subsequent, scar mobilization, passive range  of motion, compression bandaging, patient/family education, and DME and/or AE instructions  RECOMMENDED OTHER SERVICES: none at this time  CONSULTED AND AGREED WITH PLAN OF CARE: Patient  PLAN FOR NEXT SESSION: Progress per protocol   Jocelyn CHRISTELLA Bottom, OT 03/10/2024, 3:43 PM

## 2024-03-18 ENCOUNTER — Encounter: Payer: Self-pay | Admitting: Occupational Therapy

## 2024-03-18 ENCOUNTER — Ambulatory Visit: Attending: Orthopedic Surgery | Admitting: Occupational Therapy

## 2024-03-18 DIAGNOSIS — R600 Localized edema: Secondary | ICD-10-CM | POA: Insufficient documentation

## 2024-03-18 DIAGNOSIS — M79641 Pain in right hand: Secondary | ICD-10-CM | POA: Insufficient documentation

## 2024-03-18 DIAGNOSIS — R208 Other disturbances of skin sensation: Secondary | ICD-10-CM | POA: Diagnosis not present

## 2024-03-18 DIAGNOSIS — M79642 Pain in left hand: Secondary | ICD-10-CM | POA: Insufficient documentation

## 2024-03-18 DIAGNOSIS — M6281 Muscle weakness (generalized): Secondary | ICD-10-CM | POA: Insufficient documentation

## 2024-03-18 NOTE — Therapy (Signed)
 OUTPATIENT OCCUPATIONAL THERAPY ORTHO TREATMENT  Patient Name: Cassandra Hughes MRN: [id number] DOB:1971/08/10, 53 y.o., female Today's Date: 03/18/2024  PCP: Billy Knee, FNP REFERRING PROVIDER: Arlinda Buster, MD  END OF SESSION:  OT End of Session - 03/18/24 1023     Visit Number 5    Number of Visits 12    Date for OT Re-Evaluation 04/14/24    Authorization Type [employer] - NO auth required, 27 visits    OT Start Time 1020    OT Stop Time 1100    OT Time Calculation (min) 40 min    Activity Tolerance Patient tolerated treatment well    Behavior During Therapy WFL for tasks assessed/performed          Past Medical History:  Diagnosis Date   Migraines    Thyroid  disease    Past Surgical History:  Procedure Laterality Date   CESAREAN SECTION     CHOLECYSTECTOMY, LAPAROSCOPIC  01/2016   CYST REMOVAL NECK     PELVIC LAPAROSCOPY     fibroid    SKIN CANCER EXCISION N/A    TONSILLECTOMY AND ADENOIDECTOMY     Patient Active Problem List   Diagnosis Date Noted   Migraine without aura and with status migrainosus, not intractable 03/08/2023   Vitamin D  deficiency 03/08/2023   Hyperlipidemia 03/08/2023   Acquired hypothyroidism 03/08/2023   Intramural leiomyoma of uterus 02/12/2017   DUB (dysfunctional uterine bleeding) 02/12/2017   Family history of cancer of gallbladder 10/15/2015    ONSET DATE: 02/11/2024  REFERRING DIAG: G56.02 (ICD-10-CM) - Carpal tunnel syndrome, left upper limbG56.03 (ICD-10-CM) - Bilateral carpal tunnel syndrome  Note: Right  OPEN CARPAL TUNNEL RELEASE, 64721 S/p Rt carpal tunnel release 01/24/24 S/P Lt CTR 02/21/24  THERAPY DIAG:  Muscle weakness (generalized)  Other disturbances of skin sensation  Pain in right hand  Pain in left hand  Localized edema  Rationale for Evaluation and Treatment: Rehabilitation  SUBJECTIVE:   SUBJECTIVE STATEMENT: No pain but just stiffness today. My Rt hand is actually worse than Lt hand.  Still have numbness fingertips of long and ring fingers both hands  Pt accompanied by: self  PERTINENT HISTORY: Migraines, thyroid  disease  PRECAUTIONS: Other: s/p Rt CTR 01/24/24 - No strengthening yet until 4 weeks post-op. S/p LT CTR 02/21/24 - No strengthening for 4 weeks post op   WEIGHT BEARING RESTRICTIONS: limited per protocol  PAIN:  Are you having pain? None at time of eval but occasional nerve pain  FALLS: Has patient fallen in last 6 months? No  LIVING ENVIRONMENT: Lives with: lives with their family (64 y.o. 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  HAND DOMINANCE: Right  ADLs: Overall ADLs: mod I for BADLs, doing most IADLS w/ son to assist prn for heavier lifting  FUNCTIONAL OUTCOME MEASURES: Quick Dash: 16% RUE Deficit 03/10/2024: 13.6 % deficit with BUE  UPPER EXTREMITY ROM:   BUE AROM WNLS  HAND FUNCTION: Unable to assess grip strength d/t current precautions 03/18/24: Grip Rt = 49.1 lbs, Lt = 43.2 lbs  COORDINATION: 9 Hole Peg test: Right: 18 sec; Left: 19 sec  SENSATION:  Still reports numbness tip of long finger and radial side of ring finger. Thumb sensation improved and pain improved since surgery  EDEMA: mild   COGNITION: Overall cognitive status: Within functional limits for tasks assessed   OBSERVATIONS: stitches removed, incision healing nicely   TREATMENT:   Fluidotherapy x 10 minutes for both hands to address stiffness, pain, and sensory retraining. No adverse reactions.   Upgraded to red resistance putty for Rt hand and reviewed putty HEP.  Initiated putty HEP (same given previously  for Rt hand) for Lt hand today (yellow putty) as pt is almost 4 weeks post-op and pt return demo of each.  Pt appears to have thicker scar tissue and more edema Rt hand than more recent Lt hand sx.  Ultrasound x 8 minutes, 3 Mhz, 1.0 wts/cm2, 20% pulsed over Rt palm at incision for scar tissue management and over palm/base of MP's for edema control    PATIENT EDUCATION: Education details: see above Person educated: Patient Education method: Programmer, multimedia, Demonstration, Verbal cues, and Handouts Education comprehension: verbalized understanding, returned demonstration, and verbal cues required  HOME EXERCISE PROGRAM: 02/12/24: CTR home program, median nerve gliding ex's, tendon gliding ex's, scar massage (for RUE)  02/20/24: putty HEP (for RUE)  03/10/2024:  joint protection, ergonomics, and Optometrist principles  GOALS: Goals reviewed with patient? Yes    SHORT TERM GOALS: Target date: 03/14/24   Patient will demonstrate initial RUE HEP with 25% verbal cues or less for proper execution. Baseline: New to outpt OT Goal status: MET     2.  Pt will independently recall at least 3 joint protection, ergonomics, and body mechanic principles as noted in pt instructions to assist with daily tasks with increased comfort and confidence.  Baseline:  New to outpt OT Goal status: MET   3.  Patient will demonstrate at least 25 lbs grip strength Rt hand to open jars and other containers. Baseline: unable to assess d/t current precautions  Goal status: MET (02/26/24: 46.7 LBS)    5.  Pt will improve Quick Dash for RUE as evidenced by 5% deficit decrease  Baseline: 16% 03/10/2024: 13.6% Goal status: IN PROGRESS     LONG TERM GOALS: Target date: 04/14/24   Patient will demonstrate updated RUE and LUE HEP with visual instruction only for proper execution. Baseline: unable to progress d/t current precautions Goal status: MET   2.  Pt will return to using Rt hand as dominant hand for all ADLS, and Lt  hand as assist  Baseline:  New to outpt OT Goal status: MET   3.  Pt will be independent with home based pain management routine to potentially include gloves/splints, heat and joint protection principles for minimal pain. Baseline: not yet addressed Goal status: IN PROGRESS   4.  Patient will be assessed for Quick Dash on LUE following Lt CTR and then show 10% deficit reduction by discharge  Baseline: TBA Goal status: INITIAL  5. Pt will demo 35 lbs or greater grip strength on Rt dominant hand and 20 lbs or greater grip strength on Lt hand   Baseline: unable to assess d/t current precautions  Goal status: MET (Rt = 49 lbs, Lt = 43 lbs)   ASSESSMENT:  CLINICAL IMPRESSION: Pt progressing well towards goals. Good understanding of joint protection and tendon glides. Will continue to progress per protocol.   PERFORMANCE DEFICITS: in functional skills including ADLs, IADLs, coordination, proprioception, sensation, edema, ROM, strength, pain, Fine motor control, decreased knowledge of precautions, decreased  knowledge of use of DME, skin integrity, and UE functional use.   IMPAIRMENTS: are limiting patient from ADLs, IADLs, rest and sleep, work, leisure, and social participation.   COMORBIDITIES: may have co-morbidities  that affects occupational performance. Patient will benefit from skilled OT to address above impairments and improve overall function.  REHAB POTENTIAL: Good  PLAN:  OT FREQUENCY: 1-2x/week  OT DURATION: 8 weeks  PLANNED INTERVENTIONS: 97535 self care/ADL training, 02889 therapeutic exercise, 97530 therapeutic activity, 97140 manual therapy, 97035 ultrasound, 97018 paraffin, 02960 fluidotherapy, 97010 moist heat, 97010 cryotherapy, 97034 contrast bath, 97760 Orthotic Initial, 97763 Orthotic/Prosthetic subsequent, scar mobilization, passive range of motion, compression bandaging, patient/family education, and DME and/or AE instructions  RECOMMENDED OTHER SERVICES: none  at this time  CONSULTED AND AGREED WITH PLAN OF CARE: Patient  PLAN FOR NEXT SESSION: Progress per protocol, IASTM for RT hand, continue fluido both hands, pulsed US  prn.    Burnard JINNY Roads, OT 03/18/2024, 10:23 AM

## 2024-03-25 ENCOUNTER — Encounter: Payer: Self-pay | Admitting: Occupational Therapy

## 2024-03-25 ENCOUNTER — Ambulatory Visit: Admitting: Occupational Therapy

## 2024-03-25 DIAGNOSIS — M6281 Muscle weakness (generalized): Secondary | ICD-10-CM

## 2024-03-25 DIAGNOSIS — R600 Localized edema: Secondary | ICD-10-CM

## 2024-03-25 DIAGNOSIS — M79642 Pain in left hand: Secondary | ICD-10-CM | POA: Diagnosis not present

## 2024-03-25 DIAGNOSIS — R208 Other disturbances of skin sensation: Secondary | ICD-10-CM | POA: Diagnosis not present

## 2024-03-25 DIAGNOSIS — M79641 Pain in right hand: Secondary | ICD-10-CM | POA: Diagnosis not present

## 2024-03-25 NOTE — Therapy (Signed)
 OUTPATIENT OCCUPATIONAL THERAPY ORTHO TREATMENT  Patient Name: Cassandra Hughes MRN: [id number] DOB:1971/01/08, 53 y.o., female Today's Date: 03/25/2024  PCP: Billy Knee, FNP REFERRING PROVIDER: Arlinda Buster, MD  END OF SESSION:  OT End of Session - 03/25/24 0943     Visit Number 6    Number of Visits 12    Date for OT Re-Evaluation 04/14/24    Authorization Type [employer] - NO auth required, 27 visits    OT Start Time 0935    OT Stop Time 1015    OT Time Calculation (min) 40 min    Activity Tolerance Patient tolerated treatment well    Behavior During Therapy [facility] for tasks assessed/performed          Past Medical History:  Diagnosis Date   Migraines    Thyroid  disease    Past Surgical History:  Procedure Laterality Date   CESAREAN SECTION     CHOLECYSTECTOMY, LAPAROSCOPIC  01/2016   CYST REMOVAL NECK     PELVIC LAPAROSCOPY     fibroid    SKIN CANCER EXCISION N/A    TONSILLECTOMY AND ADENOIDECTOMY     Patient Active Problem List   Diagnosis Date Noted   Migraine without aura and with status migrainosus, not intractable 03/08/2023   Vitamin D  deficiency 03/08/2023   Hyperlipidemia 03/08/2023   Acquired hypothyroidism 03/08/2023   Intramural leiomyoma of uterus 02/12/2017   DUB (dysfunctional uterine bleeding) 02/12/2017   Family history of cancer of gallbladder 10/15/2015    ONSET DATE: 02/11/2024  REFERRING DIAG: G56.02 (ICD-10-CM) - Carpal tunnel syndrome, left upper limbG56.03 (ICD-10-CM) - Bilateral carpal tunnel syndrome  Note: Right  OPEN CARPAL TUNNEL RELEASE, 64721 S/p Rt carpal tunnel release 01/24/24 S/P Lt CTR 02/21/24  THERAPY DIAG:  Other disturbances of skin sensation  Localized edema  Muscle weakness (generalized)  Rationale for Evaluation and Treatment: Rehabilitation  SUBJECTIVE:   SUBJECTIVE STATEMENT: No pain but just that stiffness. My Rt hand is still tighter than Lt hand. Still have numbness fingertips of long and  ring fingers both hands. Pt reports putty HEP going well  Pt accompanied by: self  PERTINENT HISTORY: Migraines, thyroid  disease  PRECAUTIONS: Other: s/p Rt CTR 01/24/24 - No strengthening yet until 4 weeks post-op. S/p LT CTR 02/21/24 - No strengthening for 4 weeks post op   WEIGHT BEARING RESTRICTIONS: limited per protocol  PAIN:  Are you having pain? None at time of eval but occasional nerve pain  FALLS: Has patient fallen in last 6 months? No  LIVING ENVIRONMENT: Lives with: lives with their family (104 y.o. 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  HAND DOMINANCE: Right  ADLs: Overall ADLs: mod I for BADLs, doing most IADLS w/ son to assist prn for heavier lifting  FUNCTIONAL OUTCOME MEASURES: Quick Dash: 16% RUE Deficit 03/10/2024: 13.6 % deficit with BUE  UPPER EXTREMITY ROM:   BUE AROM WNLS  HAND FUNCTION: Unable to assess grip strength d/t current precautions 03/18/24: Grip Rt = 49.1 lbs, Lt = 43.2 lbs  COORDINATION: 9 Hole Peg test: Right: 18 sec; Left: 19 sec  SENSATION: Still reports numbness tip  of long finger and radial side of ring finger. Thumb sensation improved and pain improved since surgery  EDEMA: mild   COGNITION: Overall cognitive status: Within functional limits for tasks assessed   OBSERVATIONS: stitches removed, incision healing nicely   TREATMENT:   Fluidotherapy x 10 minutes for both hands to address stiffness, pain, and sensory retraining. No adverse reactions.   Pt instructed she can now upgrade to red resistance putty for Lt hand Pt appears to have thicker scar tissue and more  edema Rt hand than more recent Lt hand sx.  Therapist completed IASTM using edge mobility tool at site of incision using deep prep lotion as emollient for myofascial restrictions, reduction of scar tissue, improve soft tissue mobility, and to enhance functional range of motion of affected extremity. Improved appearance to incision noted upon completion with less palpable adhesions. Ultrasound x 8 minutes, 3 Mhz, 1.0 wts/cm2, 20% pulsed over Rt palm at incision for scar tissue management and over palm/base of MP's for edema control     PATIENT EDUCATION: Education details: see above Person educated: Patient Education method: Programmer, multimedia, Demonstration, Verbal cues, and Handouts Education comprehension: verbalized understanding, returned demonstration, and verbal cues required  HOME EXERCISE PROGRAM: 02/12/24: CTR home program, median nerve gliding ex's, tendon gliding ex's, scar massage (for RUE)  02/20/24: putty HEP (for RUE)  03/10/2024:  joint protection, ergonomics, and Optometrist principles  GOALS: Goals reviewed with patient? Yes    SHORT TERM GOALS: Target date: 03/14/24   Patient will demonstrate initial RUE HEP with 25% verbal cues or less for proper execution. Baseline: New to outpt OT Goal status: MET     2.  Pt will independently recall at least 3 joint protection, ergonomics, and body mechanic principles as noted in pt instructions to assist with daily tasks with increased comfort and confidence.  Baseline:  New to outpt OT Goal status: MET   3.  Patient will demonstrate at least 25 lbs grip strength Rt hand to open jars and other containers. Baseline: unable to assess d/t current precautions  Goal status: MET (02/26/24: 46.7 LBS)    5.  Pt will improve Quick Dash  as evidenced by 5% deficit decrease  Baseline: 16% 03/10/2024: 13.6% Goal status:  REVISED     LONG TERM GOALS: Target date: 04/14/24   Patient will demonstrate updated RUE and LUE HEP with visual  instruction only for proper execution. Baseline: unable to progress d/t current precautions Goal status: MET   2.  Pt will return to using Rt hand as dominant hand for all ADLS, and Lt hand as assist  Baseline:  New to outpt OT Goal status: MET   3.  Pt will be independent with home based pain management routine to potentially include gloves/splints, heat and joint protection principles for minimal pain. Baseline: not yet addressed Goal status: MET    4. Pt will demo 35 lbs or greater grip strength on Rt dominant hand and 20 lbs or greater grip strength on Lt hand   Baseline: unable to assess d/t current precautions  Goal status: MET (Rt = 49 lbs, Lt = 43 lbs)    ASSESSMENT:  CLINICAL IMPRESSION: Pt progressing well towards goals. Good understanding of joint protection and tendon glides. Pt benefits from manual therapy and US  to manage scar tissue   PERFORMANCE DEFICITS: in functional skills including ADLs, IADLs, coordination, proprioception, sensation, edema, ROM, strength, pain, Fine motor control, decreased knowledge of precautions, decreased knowledge of use of DME, skin integrity,  and UE functional use.   IMPAIRMENTS: are limiting patient from ADLs, IADLs, rest and sleep, work, leisure, and social participation.   COMORBIDITIES: may have co-morbidities  that affects occupational performance. Patient will benefit from skilled OT to address above impairments and improve overall function.  REHAB POTENTIAL: Good  PLAN:  OT FREQUENCY: 1-2x/week  OT DURATION: 8 weeks  PLANNED INTERVENTIONS: 97535 self care/ADL training, 02889 therapeutic exercise, 97530 therapeutic activity, 97140 manual therapy, 97035 ultrasound, 97018 paraffin, 02960 fluidotherapy, 97010 moist heat, 97010 cryotherapy, 97034 contrast bath, 97760 Orthotic Initial, 97763 Orthotic/Prosthetic subsequent, scar mobilization, passive range of motion, compression bandaging, patient/family education, and DME and/or AE  instructions  RECOMMENDED OTHER SERVICES: none at this time  CONSULTED AND AGREED WITH PLAN OF CARE: Patient  PLAN FOR NEXT SESSION:  continue fluido both hands, ? Continue IASTM and pulsed US  prn, further discuss work tasks - modifications as able, reassess Quick Dash and grip strength bilaterally, D/C next session    Burnard JINNY Roads, OT 03/25/2024, 9:43 AM

## 2024-04-02 ENCOUNTER — Encounter: Admitting: Occupational Therapy

## 2024-04-05 ENCOUNTER — Other Ambulatory Visit: Payer: Self-pay | Admitting: Internal Medicine

## 2024-04-05 DIAGNOSIS — G43001 Migraine without aura, not intractable, with status migrainosus: Secondary | ICD-10-CM

## 2024-04-07 ENCOUNTER — Ambulatory Visit (INDEPENDENT_AMBULATORY_CARE_PROVIDER_SITE_OTHER): Admitting: Orthopedic Surgery

## 2024-04-07 ENCOUNTER — Telehealth: Payer: Self-pay

## 2024-04-07 ENCOUNTER — Other Ambulatory Visit (HOSPITAL_COMMUNITY): Payer: Self-pay

## 2024-04-07 DIAGNOSIS — Z9889 Other specified postprocedural states: Secondary | ICD-10-CM

## 2024-04-07 NOTE — Telephone Encounter (Signed)
 Pharmacy Patient Advocate Encounter  Received notification from [facility] that Prior Authorization for Rizatriptan  Benzoate 10MG  tablets has been APPROVED from 04/07/2024 to 10/11/2024   PA #/Case ID/Reference #: [id number]

## 2024-04-07 NOTE — Progress Notes (Signed)
   Cassandra Hughes - 53 y.o. female MRN [id number]  Date of birth: [birth year]  Office Visit Note: Visit Date: 04/07/2024 PCP: Billy Knee, FNP Referred by: Billy Knee, FNP  Subjective:  HPI: Cassandra Hughes is a 53 y.o. female who presents today for follow up 6 weeks status post left open carpal tunnel release.  She is doing very well overall, numbness and tingling is improving significantly bilaterally.  She has resumed work full-time without significant issue.  Is pleased with her progress outcome.  Pertinent ROS were reviewed with the patient and found to be negative unless otherwise specified above in HPI.   Assessment & Plan: Visit Diagnoses:  1. S/P carpal tunnel release     Plan: She continues to do very well postoperatively.  I am pleased to see that she has resumed activities without any significant restriction.  Her numbness and tingling continues to improve dramatically which is excellent.  There is some residual numbness in the distal aspects of the long and ring finger which I once again explained will take an extensive period of time to see full results after the nerve release bilaterally.  She expressed full understanding, will return in approximate 6 weeks.  Follow-up: No follow-ups on file.   Meds & Orders: No orders of the defined types were placed in this encounter.  No orders of the defined types were placed in this encounter.    Procedures: No procedures performed       Objective:   Vital Signs: There were no vitals taken for this visit.  Ortho Exam Bilateral hand: - Well-healing palmar incision - Composite fist without restriction - Sensation intact to light touch median distribution, remains slightly diminished at the distal aspect of the long finger, distal to the DIP region - 5/5 APB no thenar atrophy - Grip strength Jamar 2, Right 60, Left 60   Imaging: No results found.   Cassandra Hughes Cassandra Hughes, M.D. [facility], Hand Surgery

## 2024-04-15 DIAGNOSIS — L814 Other melanin hyperpigmentation: Secondary | ICD-10-CM | POA: Diagnosis not present

## 2024-04-15 DIAGNOSIS — L821 Other seborrheic keratosis: Secondary | ICD-10-CM | POA: Diagnosis not present

## 2024-04-15 DIAGNOSIS — D1801 Hemangioma of skin and subcutaneous tissue: Secondary | ICD-10-CM | POA: Diagnosis not present

## 2024-04-15 DIAGNOSIS — L738 Other specified follicular disorders: Secondary | ICD-10-CM | POA: Diagnosis not present

## 2024-05-02 ENCOUNTER — Other Ambulatory Visit: Payer: Self-pay | Admitting: Medical Genetics

## 2024-05-13 ENCOUNTER — Encounter: Payer: Self-pay | Admitting: "Endocrinology

## 2024-05-13 ENCOUNTER — Ambulatory Visit: Admitting: "Endocrinology

## 2024-05-13 VITALS — BP 116/80 | HR 84 | Ht 64.0 in | Wt 195.0 lb

## 2024-05-13 DIAGNOSIS — E063 Autoimmune thyroiditis: Secondary | ICD-10-CM | POA: Diagnosis not present

## 2024-05-13 LAB — T4, FREE: Free T4: 0.8 ng/dL (ref 0.8–1.8)

## 2024-05-13 LAB — T3, FREE: T3, Free: 3.6 pg/mL (ref 2.3–4.2)

## 2024-05-13 LAB — TSH: TSH: 0.8 m[IU]/L

## 2024-05-13 NOTE — Progress Notes (Signed)
 Outpatient Endocrinology Note Obadiah Birmingham, MD  05/13/24   Cassandra Hughes 1971-03-06 [id number]  Referring Provider: Billy Knee, FNP Primary Care Provider: Billy Knee, FNP Subjective  No chief complaint on file.   Assessment & Plan  Diagnoses and all orders for this visit:  Hashimoto thyroiditis -     TSH -     T4, free -     T3, free   Cassandra Hughes is currently taking NP thyroid  60 mg 2 pills 5 days a week and 3 pills 2 days a week. Last TSH lab in system was normal 10 mo ago. Educated on thyroid  axis.  Recommend the following: Take NP thyroid  60mg  2 pills 5 days a week and 3 pills 2 days a week. Advised to take levothyroxine first thing in the morning on empty stomach and wait at least 30 minutes to 1 hour before eating or drinking anything or taking any other medications. Space out levothyroxine by 4 hours from any acid reflux medication/fibrate/iron/calcium/multivitamin. Advised to take birth control pills and nutritional supplements in the evening. Repeat lab before next visit or sooner if symptoms of hyperthyroidism or hypothyroidism develop.  Notify us  immediately in case of pregnancy/breastfeeding or significant weight gain or loss. Counseled on compliance and follow up needs.  I have reviewed current medications, nurse's notes, allergies, vital signs, past medical and surgical history, family medical history, and social history for this encounter. Counseled patient on symptoms, examination findings, lab findings, imaging results, treatment decisions and monitoring and prognosis. The patient understood the recommendations and agrees with the treatment plan. All questions regarding treatment plan were fully answered.   Return in about 4 months (around 09/12/2024) for visit + labs before next visit, labs today.   Obadiah Birmingham, MD  05/13/24   I have reviewed current medications, nurse's notes, allergies, vital signs, past medical and surgical history,  family medical history, and social history for this encounter. Counseled patient on symptoms, examination findings, lab findings, imaging results, treatment decisions and monitoring and prognosis. The patient understood the recommendations and agrees with the treatment plan. All questions regarding treatment plan were fully answered.   History of Present Illness Cassandra Hughes is a 53 y.o. year old female who presents to our clinic with hypothyroidism diagnosed around 2010.  Symptoms suggestive of HYPOTHYROIDISM:  fatigue No weight gain No cold intolerance  Yes constipation  No  Symptoms suggestive of HYPERTHYROIDISM:  weight loss  No heat intolerance Yes hyperdefecation  No palpitations  No  Compressive symptoms:  dysphagia  No dysphonia  No positional dyspnea (especially with simultaneous arms elevation)  No  Smokes  No On biotin  No Personal history of head/neck surgery/irradiation  No   Physical Exam  BP 116/80   Pulse 84   Ht 5' 4 (1.626 m)   Wt 195 lb (88.5 kg)   SpO2 96%   BMI 33.47 kg/m  Constitutional: well developed, well nourished Head: normocephalic, atraumatic, no exophthalmos Eyes: sclera anicteric, no redness Neck: no thyromegaly, no thyroid  tenderness; no nodules palpated Lungs: normal respiratory effort Neurology: alert and oriented, no fine hand tremor Skin: dry, no appreciable rashes Musculoskeletal: no appreciable defects Psychiatric: normal mood and affect  Allergies Allergies  Allergen Reactions   Vicodin [Hydrocodone-Acetaminophen ] Nausea Only    Current Medications Patient's Medications  New Prescriptions   No medications on file  Previous Medications   ACETAMINOPHEN -CODEINE  (TYLENOL  #3) 300-30 MG TABLET    Take 1 tablet by mouth every 6 (six) hours as  needed.   CHOLECALCIFEROL (VITAMIN D3) 1.25 MG (50000 UT) CAPS    Take 1 capsule (1.25 mg total) by mouth once a week.   MECLIZINE  (ANTIVERT ) 25 MG TABLET    Take 1 tablet (25 mg  total) by mouth 3 (three) times daily as needed for dizziness.   MELOXICAM  (MOBIC ) 15 MG TABLET    Take 1 tablet (15 mg total) by mouth daily.   ONDANSETRON  (ZOFRAN -ODT) 4 MG DISINTEGRATING TABLET    Take 1 tablet (4 mg total) by mouth every 8 (eight) hours as needed for nausea or vomiting.   RIZATRIPTAN  (MAXALT ) 10 MG TABLET    TAKE 1 TABLET BY MOUTH AT ONSET OF MIGRAINE; MAY REPEAT 1 TABLET IN 2 HOURS IF NEEDED.   THYROID  (ARMOUR) 120 MG TABLET    as directed.   THYROID  (ARMOUR) 30 MG TABLET    as directed.  Modified Medications   No medications on file  Discontinued Medications   No medications on file    Past Medical History Past Medical History:  Diagnosis Date   Migraines    Thyroid  disease     Past Surgical History Past Surgical History:  Procedure Laterality Date   CESAREAN SECTION     CHOLECYSTECTOMY, LAPAROSCOPIC  01/2016   CYST REMOVAL NECK     PELVIC LAPAROSCOPY     fibroid    SKIN CANCER EXCISION N/A    TONSILLECTOMY AND ADENOIDECTOMY      Family History family history includes Cancer in her father, maternal grandmother, and mother; Heart disease in her sister; Migraines in her sister.  Social History Social History   Socioeconomic History   Marital status: Single    Spouse name: Not on file   Number of children: Not on file   Years of education: Not on file   Highest education level: Associate degree: occupational, Scientist, product/process development, or vocational program  Occupational History   Not on file  Tobacco Use   Smoking status: Former    Types: Cigarettes   Smokeless tobacco: Never  Vaping Use   Vaping status: Never Used  Substance and Sexual Activity   Alcohol use: Yes    Comment: rarely   Drug use: No   Sexual activity: Not Currently    Birth control/protection: OCP    Comment: First IC >16 y/o, 5-7 Partners, Hx of +TV  Other Topics Concern   Not on file  Social History Narrative   Not on file   Social Drivers of Health   Financial Resource Strain:  Low Risk  (12/22/2023)   Overall Financial Resource Strain (CARDIA)    Difficulty of Paying Living Expenses: Not very hard  Food Insecurity: No Food Insecurity (12/22/2023)   Hunger Vital Sign    Worried About Running Out of Food in the Last Year: Never true    Ran Out of Food in the Last Year: Never true  Transportation Needs: No Transportation Needs (12/22/2023)   PRAPARE - Administrator, Civil Service (Medical): No    Lack of Transportation (Non-Medical): No  Physical Activity: Insufficiently Active (12/22/2023)   Exercise Vital Sign    Days of Exercise per Week: 3 days    Minutes of Exercise per Session: 20 min  Stress: No Stress Concern Present (12/22/2023)   [employer] of Occupational Health - Occupational Stress Questionnaire    Feeling of Stress : Only a little  Social Connections: Socially Isolated (12/22/2023)   Social Connection and Isolation Panel    Frequency of Communication  with Friends and Family: Once a week    Frequency of Social Gatherings with Friends and Family: Once a week    Attends Religious Services: Never    Database administrator or Organizations: No    Attends Engineer, structural: Not on file    Marital Status: Never married  Intimate Partner Violence: Unknown (11/18/2021)   Received from [facility]   HITS    Physically Hurt: Not on file    Insult or Talk Down To: Not on file    Threaten Physical Harm: Not on file    Scream or Curse: Not on file    Laboratory Investigations Lab Results  Component Value Date   TSH 1.16 06/25/2023   TSH 3.98 03/08/2023     No results found for: TSI   No components found for: TRAB   Lab Results  Component Value Date   CHOL 215 (H) 06/25/2023   Lab Results  Component Value Date   HDL 45.30 06/25/2023   Lab Results  Component Value Date   LDLCALC 137 (H) 06/25/2023   Lab Results  Component Value Date   TRIG 167.0 (H) 06/25/2023   Lab Results  Component Value Date   CHOLHDL  5 06/25/2023   Lab Results  Component Value Date   CREATININE 0.74 12/26/2023   Lab Results  Component Value Date   GFR 92.65 12/26/2023      Component Value Date/Time   NA 138 12/26/2023 1448   K 4.0 12/26/2023 1448   CL 104 12/26/2023 1448   CO2 27 12/26/2023 1448   GLUCOSE 88 12/26/2023 1448   BUN 18 12/26/2023 1448   CREATININE 0.74 12/26/2023 1448   CREATININE 0.76 08/28/2022 0819   CALCIUM 9.1 12/26/2023 1448   PROT 6.6 06/25/2023 1344   ALBUMIN 4.3 06/25/2023 1344   AST 15 06/25/2023 1344   ALT 16 06/25/2023 1344   ALKPHOS 68 06/25/2023 1344   BILITOT 0.7 06/25/2023 1344      Latest Ref Rng & Units 12/26/2023    2:48 PM 06/25/2023    1:44 PM 03/08/2023    9:01 AM  BMP  Glucose 70 - 99 mg/dL 88  79  96   BUN 6 - 23 mg/dL 18  10  9    Creatinine 0.40 - 1.20 mg/dL 9.25  9.23  9.25   Sodium 135 - 145 mEq/L 138  138  138   Potassium 3.5 - 5.1 mEq/L 4.0  4.0  3.9   Chloride 96 - 112 mEq/L 104  103  106   CO2 19 - 32 mEq/L 27  26  26    Calcium 8.4 - 10.5 mg/dL 9.1  9.2  8.9        Component Value Date/Time   WBC 6.5 06/25/2023 1344   RBC 4.71 06/25/2023 1344   HGB 14.7 06/25/2023 1344   HCT 42.7 06/25/2023 1344   PLT 199.0 06/25/2023 1344   MCV 90.6 06/25/2023 1344   MCH 30.6 08/28/2022 0819   MCHC 34.4 06/25/2023 1344   RDW 12.3 06/25/2023 1344   LYMPHSABS 2.3 06/25/2023 1344   MONOABS 0.4 06/25/2023 1344   EOSABS 0.1 06/25/2023 1344   BASOSABS 0.1 06/25/2023 1344      Parts of this note may have been dictated using voice recognition software. There may be variances in spelling and vocabulary which are unintentional. Not all errors are proofread. Please notify the dino if any discrepancies are noted or if the meaning of any statement  is not clear.

## 2024-05-14 ENCOUNTER — Telehealth: Payer: Self-pay | Admitting: "Endocrinology

## 2024-05-14 ENCOUNTER — Other Ambulatory Visit: Payer: Self-pay | Admitting: "Endocrinology

## 2024-05-14 ENCOUNTER — Ambulatory Visit: Payer: Self-pay | Admitting: "Endocrinology

## 2024-05-14 DIAGNOSIS — E063 Autoimmune thyroiditis: Secondary | ICD-10-CM

## 2024-05-14 NOTE — Telephone Encounter (Signed)
 Patient is calling to ask if Dr. Motwani is going to send in a prescription for medication(s) for her.  Patient states that she uses   [facility] [facility] [id number] - [place], [place] - [address] (Ph: [phone])    Patient would like a call back to let her know if and when medication is sent in as well as the name of the medications that will be sent in.

## 2024-05-15 ENCOUNTER — Other Ambulatory Visit: Payer: Self-pay | Admitting: "Endocrinology

## 2024-05-15 MED ORDER — THYROID 60 MG PO TABS: ORAL_TABLET | ORAL | 5 refills | Status: DC

## 2024-05-19 ENCOUNTER — Encounter: Admitting: Orthopedic Surgery

## 2024-05-21 ENCOUNTER — Other Ambulatory Visit

## 2024-05-21 DIAGNOSIS — Z006 Encounter for examination for normal comparison and control in clinical research program: Secondary | ICD-10-CM

## 2024-05-30 LAB — GENECONNECT MOLECULAR SCREEN: Genetic Analysis Overall Interpretation: NEGATIVE

## 2024-06-01 ENCOUNTER — Other Ambulatory Visit: Payer: Self-pay | Admitting: Internal Medicine

## 2024-06-01 DIAGNOSIS — G43001 Migraine without aura, not intractable, with status migrainosus: Secondary | ICD-10-CM

## 2024-06-07 LAB — COLOGUARD: Cologuard: NEGATIVE

## 2024-06-08 DIAGNOSIS — Z1211 Encounter for screening for malignant neoplasm of colon: Secondary | ICD-10-CM | POA: Diagnosis not present

## 2024-06-11 ENCOUNTER — Other Ambulatory Visit: Payer: Self-pay | Admitting: Internal Medicine

## 2024-06-11 DIAGNOSIS — M47812 Spondylosis without myelopathy or radiculopathy, cervical region: Secondary | ICD-10-CM

## 2024-06-14 LAB — COLOGUARD: COLOGUARD: NEGATIVE

## 2024-06-16 ENCOUNTER — Encounter: Payer: Self-pay | Admitting: Internal Medicine

## 2024-06-16 ENCOUNTER — Encounter: Payer: Self-pay | Admitting: Radiology

## 2024-06-24 ENCOUNTER — Ambulatory Visit: Admitting: Orthopedic Surgery

## 2024-06-24 DIAGNOSIS — M65311 Trigger thumb, right thumb: Secondary | ICD-10-CM

## 2024-06-24 NOTE — Progress Notes (Unsigned)
   Cassandra Hughes - 53 y.o. female MRN [id number]  Date of birth: 1970/09/23  Office Visit Note: Visit Date: 06/24/2024 PCP: Billy Knee, FNP Referred by: Arlinda Buster, MD  Subjective:  HPI: Cassandra Hughes is a 53 y.o. female who presents today for ongoing right thumb trigger digit over the past multiple weeks.  Of note, she has undergone bilateral carpal tunnel release this past year with excellent relief of her numbness and tingling.  Has not undergone prior workup or treatment for the right thumb triggering.  States that she is having significant locking often requiring manual correction.  There is associated pain as well.  Pertinent ROS were reviewed with the patient and found to be negative unless otherwise specified above in HPI.   Assessment & Plan: Visit Diagnoses:  1. Trigger thumb, right thumb      Plan: Extensive discussion was had with the patient today regarding her right thumb trigger digit.  We discussed the etiology and pathophysiology of stenosing tenosynovitis.  We discussed conservative versus surgical treatment modalities.  From a conservative standpoint, we discussed activity modification, splinting, therapy and injections.  From a surgical standpoint, we discussed the possibility for trigger digit release as well as all risk and benefits associated.  Given that she has not trialed conservative treatments, patient is appropriate candidate for cortisone injection to the right thumb A1 pulley for symptom relief.  Risks and benefit of the cortisone injection were discussed in detail, patient agreed to proceed.  Injection was provided today without issue, patient will return in approximate 6 weeks time for a recheck.   Follow-up: No follow-ups on file.   Meds & Orders: No orders of the defined types were placed in this encounter.  No orders of the defined types were placed in this encounter.    Procedures: Hand/UE Inj: R thumb A1 for trigger finger on  06/26/2024 10:36 AM Indications: pain Details: 25 G needle, volar approach Medications: 1 mL lidocaine 1 %; 6 mg betamethasone acetate-betamethasone sodium phosphate 6 (3-3) MG/ML Outcome: tolerated well, no immediate complications Consent was given by the patient. Patient was prepped and draped in the usual sterile fashion.           Objective:   Vital Signs: There were no vitals taken for this visit.  Ortho Exam Right hand: - Palpable nodule at the A1 pulley of the thumb, associated tenderness - Notable clicking with deep flexion of the thumb, notable evidence of significant locking with deep flexion - Sensation intact distally, hand remains warm well-perfused - Prior carpal tunnel incision well-healed in the palmar aspect of the hand   Imaging: No results found.   Anshul Afton Arlinda, M.D. [facility], Hand Surgery

## 2024-06-26 DIAGNOSIS — M65311 Trigger thumb, right thumb: Secondary | ICD-10-CM

## 2024-06-26 MED ORDER — BETAMETHASONE SOD PHOS & ACET 6 (3-3) MG/ML IJ SUSP
6.0000 mg | INTRAMUSCULAR | Status: AC | PRN
  Administered 2024-06-26: 6 mg via INTRA_ARTICULAR

## 2024-06-26 MED ORDER — LIDOCAINE HCL 1 % IJ SOLN
1.0000 mL | INTRAMUSCULAR | Status: AC | PRN
  Administered 2024-06-26: 1 mL

## 2024-06-30 ENCOUNTER — Ambulatory Visit: Admitting: Orthopedic Surgery

## 2024-08-01 NOTE — Progress Notes (Unsigned)
 Follow up right thumb trigger digit Injection 06/24/24   She is doing very well after the right thumb trigger digit injection.  Range of motion of the right thumb today demonstrates tendon gliding at the IP region without evidence of clicking or catching.  She is pleased with the results from the injection and will return as needed in the future.  Cassandra Belmontes, MD Hand Surgery, [place]

## 2024-08-04 ENCOUNTER — Ambulatory Visit (INDEPENDENT_AMBULATORY_CARE_PROVIDER_SITE_OTHER): Admitting: Orthopedic Surgery

## 2024-08-04 ENCOUNTER — Ambulatory Visit: Admitting: Internal Medicine

## 2024-08-04 ENCOUNTER — Encounter: Payer: Self-pay | Admitting: Internal Medicine

## 2024-08-04 VITALS — BP 124/88 | HR 74 | Temp 98.4°F | Ht 64.0 in | Wt 193.0 lb

## 2024-08-04 DIAGNOSIS — M545 Low back pain, unspecified: Secondary | ICD-10-CM | POA: Diagnosis not present

## 2024-08-04 DIAGNOSIS — M65311 Trigger thumb, right thumb: Secondary | ICD-10-CM

## 2024-08-04 DIAGNOSIS — Z0000 Encounter for general adult medical examination without abnormal findings: Secondary | ICD-10-CM

## 2024-08-04 DIAGNOSIS — G8929 Other chronic pain: Secondary | ICD-10-CM

## 2024-08-04 DIAGNOSIS — G43001 Migraine without aura, not intractable, with status migrainosus: Secondary | ICD-10-CM | POA: Diagnosis not present

## 2024-08-04 LAB — LIPID PANEL
Cholesterol: 226 mg/dL — ABNORMAL HIGH (ref 28–200)
HDL: 51.6 mg/dL
LDL Cholesterol: 148 mg/dL — ABNORMAL HIGH (ref 10–99)
NonHDL: 174.29
Total CHOL/HDL Ratio: 4
Triglycerides: 132 mg/dL (ref 10.0–149.0)
VLDL: 26.4 mg/dL (ref 0.0–40.0)

## 2024-08-04 LAB — CBC WITH DIFFERENTIAL/PLATELET
Basophils Absolute: 0.1 K/uL (ref 0.0–0.1)
Basophils Relative: 1.2 % (ref 0.0–3.0)
Eosinophils Absolute: 0.1 K/uL (ref 0.0–0.7)
Eosinophils Relative: 2.8 % (ref 0.0–5.0)
HCT: 41.3 % (ref 36.0–46.0)
Hemoglobin: 14.3 g/dL (ref 12.0–15.0)
Lymphocytes Relative: 37.4 % (ref 12.0–46.0)
Lymphs Abs: 2 K/uL (ref 0.7–4.0)
MCHC: 34.6 g/dL (ref 30.0–36.0)
MCV: 88 fl (ref 78.0–100.0)
Monocytes Absolute: 0.3 K/uL (ref 0.1–1.0)
Monocytes Relative: 5.8 % (ref 3.0–12.0)
Neutro Abs: 2.8 K/uL (ref 1.4–7.7)
Neutrophils Relative %: 52.8 % (ref 43.0–77.0)
Platelets: 213 K/uL (ref 150.0–400.0)
RBC: 4.69 Mil/uL (ref 3.87–5.11)
RDW: 11.9 % (ref 11.5–15.5)
WBC: 5.2 K/uL (ref 4.0–10.5)

## 2024-08-04 LAB — COMPREHENSIVE METABOLIC PANEL WITH GFR
ALT: 22 U/L (ref 3–35)
AST: 23 U/L (ref 5–37)
Albumin: 4.4 g/dL (ref 3.5–5.2)
Alkaline Phosphatase: 93 U/L (ref 39–117)
BUN: 9 mg/dL (ref 6–23)
CO2: 28 meq/L (ref 19–32)
Calcium: 9.1 mg/dL (ref 8.4–10.5)
Chloride: 103 meq/L (ref 96–112)
Creatinine, Ser: 0.72 mg/dL (ref 0.40–1.20)
GFR: 95.34 mL/min
Glucose, Bld: 94 mg/dL (ref 70–99)
Potassium: 4.2 meq/L (ref 3.5–5.1)
Sodium: 137 meq/L (ref 135–145)
Total Bilirubin: 0.4 mg/dL (ref 0.2–1.2)
Total Protein: 6.7 g/dL (ref 6.0–8.3)

## 2024-08-04 MED ORDER — RIZATRIPTAN BENZOATE 10 MG PO TABS: ORAL_TABLET | ORAL | 2 refills | Status: AC

## 2024-08-04 NOTE — Patient Instructions (Addendum)
" °  Low back pain:  Chiropractor  Meloxicam   Heat/ice   Human Resources Officer - Dr Lelon  [address]  [phone]  "

## 2024-08-04 NOTE — Progress Notes (Signed)
 "  Subjective:   Cassandra Hughes [birth year]  08/04/2024   CC: Chief Complaint  Patient presents with   Annual Exam    Pt presents today for a cpe. Pt is fasting. C/o back and feet hurting states she is on her feet a lot, been going on since beginning of school year she works in a school [employer]. Right lower back and mainly her right foot but the left one hurts as welll    HPI: Cassandra Hughes is a 53 y.o. female who presents for a routine health maintenance exam.  Labs  collected at time of visit.   Discussed the use of AI scribe software for clinical note transcription with the patient, who gave verbal consent to proceed.  History of Present Illness   Cassandra Hughes is a 53 year old female who presents for a routine annual physical exam.  She has been experiencing persistent right lower back pain since late August or early September, coinciding with the start of the school year. The pain is a constant ache that worsens with prolonged standing, which she does for about five to six hours a day. Occasionally, the pain becomes severe enough to disrupt her sleep. She has not sustained any known injury to her back. She has tried using a patch and takes meloxicam  as needed, which provides some relief.  She reports issues with her right knee, which has been problematic for about a year and a half. The knee pain is intermittent, sometimes worsening and sometimes improving. Additionally, she experiences pain in her right foot, specifically around the joint near the fourth and fifth toes. This pain becomes more pronounced after standing for extended periods, leading to numbness and a throbbing sensation.  She mentions a recent, unusual mid-back pain that occurs intermittently, described as a pain that 'takes your breath a little bit.' This is not a regular occurrence but has happened a couple of times recently.  She experiences migraines and uses rizatriptan  for management, noting that combining  it with meloxicam  is effective. Her migraines tend to occur in clusters. She does need refill of maxalt .   She is currently taking 60 mg of NP thyroid . She is monitored by an endocrinologist for thyroid  issues.  She smoked cigarettes off and on during her teenage years and early twenties. She drinks alcohol occasionally, more so during the holiday season, but notes she cannot handle it as she used to. 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  No thoughts of self-harm or harm to others. She reports stress and worry, particularly related to her son's transition to high school. No burning or blood in her urine, urgency, or incontinence, except for occasional near incontinence when sneezing.       HEALTH SCREENINGS: - Pap smear: Up to date  - Mammogram (40+): Up to date  - Colonoscopy (45+): Up to date - cologuard done, due 3 years  - Bone Density (65+): Not applicable  - Lung CA screening with low-dose CT:  Not applicable Adults age 53-80 who are current cigarette smokers or quit within the last 15 years. Must have 20 pack year history.   IMMUNIZATIONS: - Tdap: Tetanus vaccination status reviewed: last tetanus booster within 10 years. - HPV: Not applicable - Influenza: Up to date - obtaining records from [facility] in [place]. - Zostavax (50+): Up to date   Past medical history, surgical history, medications, allergies, family history and social history reviewed with patient today  and changes made to appropriate areas of the chart.   Social History   Socioeconomic History   Marital status: Single    Spouse name: Not on file   Number of children: Not on file   Years of education: Not on file   Highest education level: Associate degree: occupational, scientist, product/process development, or vocational program  Occupational History   Not on file  Tobacco Use   Smoking status: Former    Current packs/day: 0.00    Types: Cigarettes   Smokeless tobacco: Never    Tobacco comments:    As a teen/ young adult i smoked not regularly and quit in my late 4s  Vaping Use   Vaping status: Never Used  Substance and Sexual Activity   Alcohol use: Yes    Alcohol/week: 2.0 standard drinks of alcohol    Types: 1 Cans of beer, 1 Standard drinks or equivalent per week    Comment: rarely   Drug use: Never   Sexual activity: Not Currently    Birth control/protection: None    Comment: First IC >84 y/o, 5-7 Partners, Hx of +TV  Other Topics Concern   Not on file  Social History Narrative   Not on file   Social Drivers of Health   Tobacco Use: Medium Risk (08/04/2024)   Patient History    Smoking Tobacco Use: Former    Smokeless Tobacco Use: Never    Passive Exposure: Not on file  Financial Resource Strain: Low Risk (07/31/2024)   Overall Financial Resource Strain (CARDIA)    Difficulty of Paying Living Expenses: Not very hard  Food Insecurity: No Food Insecurity (07/31/2024)   Epic    Worried About Radiation Protection Practitioner of Food in the Last Year: Never true    Ran Out of Food in the Last Year: Never true  Transportation Needs: No Transportation Needs (07/31/2024)   Epic    Lack of Transportation (Medical): No    Lack of Transportation (Non-Medical): No  Physical Activity: Insufficiently Active (07/31/2024)   Exercise Vital Sign    Days of Exercise per Week: 3 days    Minutes of Exercise per Session: 20 min  Stress: No Stress Concern Present (07/31/2024)   [employer] of Occupational Health - Occupational Stress Questionnaire    Feeling of Stress: Only a little  Social Connections: Socially Isolated (07/31/2024)   Social Connection and Isolation Panel    Frequency of Communication with Friends and Family: Once a week    Frequency of Social Gatherings with Friends and Family: Once a week    Attends Religious Services: Never    Database Administrator or Organizations: No    Attends Engineer, Structural: Not on file    Marital Status: Never  married  Intimate Partner Violence: Unknown (11/18/2021)   Received from [facility]   HITS    Physically Hurt: Not on file    Insult or Talk Down To: Not on file    Threaten Physical Harm: Not on file    Scream or Curse: Not on file  Depression (PHQ2-9): Low Risk (08/04/2024)   Depression (PHQ2-9)    PHQ-2 Score: 2  Alcohol Screen: Low Risk (07/31/2024)   Alcohol Screen    Last Alcohol Screening Score (AUDIT): 2  Housing: Low Risk (07/31/2024)   Epic    Unable to Pay for Housing in the Last Year: No    Number of Times Moved in the Last Year: 0    Homeless in the Last Year: No  Utilities: Not on file  Health Literacy: Not on file     Past Medical History:  Diagnosis Date   Anxiety    Migraines    Thyroid  disease     Past Surgical History:  Procedure Laterality Date   CESAREAN SECTION     CHOLECYSTECTOMY  2016   CHOLECYSTECTOMY, LAPAROSCOPIC  01/2016   CYST REMOVAL NECK     PELVIC LAPAROSCOPY     fibroid    SKIN CANCER EXCISION N/A    TONSILLECTOMY AND ADENOIDECTOMY      Current Outpatient Medications on File Prior to Visit  Medication Sig   Cholecalciferol (VITAMIN D3) 1.25 MG (50000 UT) CAPS Take 1 capsule (1.25 mg total) by mouth once a week.   meloxicam  (MOBIC ) 15 MG tablet TAKE 1 TABLET BY MOUTH DAILY   thyroid  (NP THYROID ) 60 MG tablet NP thyroid  60mg  2 pills 5 days a week and 3 pills 2 days a week.   No current facility-administered medications on file prior to visit.    Allergies[1]  Family History  Problem Relation Age of Onset   Cancer Mother        gallbladder    Cancer Father        prostate   Cancer Maternal Grandmother        Gallbladder cancer    Heart disease Sister    Migraines Sister    Breast cancer Neg Hx      ROS: Denies fever, fatigue, unexplained weight loss/gain, hearing or vision changes, cardiac or respiratory complaints. Denies neurological deficits, gastrointestinal or genitourinary complaints, mental health complaints, and  skin changes.   Objective:   Today's Vitals   08/04/24 1001  BP: 124/88  Pulse: 74  Temp: 98.4 F (36.9 C)  SpO2: 98%  Weight: 193 lb (87.5 kg)  Height: 5' 4 (1.626 m)    GENERAL APPEARANCE: Well-appearing, in NAD. Well nourished.  SKIN: Pink, warm and dry. Turgor normal. No rash, lesion, ulceration, or ecchymoses. Hair evenly distributed.  HEENT: HEAD: Normocephalic.  EYES: PERRLA. EOMI. Lids intact w/o defect. Sclera white, Conjunctiva pink w/o exudate.  EARS: External ear w/o redness, swelling, masses or lesions. EAC clear. TM's intact, translucent w/o bulging, appropriate landmarks visualized. Appropriate acuity to conversational tones.  NOSE: Septum midline w/o deformity. Nares patent, mucosa pink and non-inflamed w/o drainage.  THROAT: Uvula midline. Oropharynx clear. Tonsils absent. Oral mucosa pink and moist.  NECK: Supple, Trachea midline. Full ROM w/o pain or tenderness. No lymphadenopathy. Thyroid  non-tender w/o enlargement or palpable masses.  RESPIRATORY: Chest wall symmetrical w/o masses. Respirations even and non-labored. Breath sounds clear to auscultation bilaterally. No wheezes, rales, rhonchi, or crackles. CARDIAC: S1, S2 present, regular rate and rhythm. No gallops, murmurs, rubs, or clicks. Capillary refill <2 seconds. Peripheral pulses 2+ bilaterally. GI: Abdomen soft w/o distention. Normoactive bowel sounds. No palpable masses or tenderness. No guarding or rebound tenderness. Liver and spleen w/o tenderness or enlargement. No CVA tenderness.  MSK: Muscle tone and strength appropriate for age, w/o atrophy or abnormal movement.  EXTREMITIES: Active ROM intact, w/o tenderness, crepitus, or contracture. No obvious joint deformities or effusions. No clubbing, edema, or cyanosis.  NEUROLOGIC: CN's II-XII intact. Motor strength symmetrical with no obvious weakness. No sensory deficits. Steady, even gait.  PSYCH/MENTAL STATUS: Alert, oriented x 3. Cooperative,  appropriate mood and affect.    Depression and Anxiety Screen done today and results listed below:     08/04/2024   10:03 AM 12/26/2023    2:14 PM  11/12/2023    2:28 PM 06/25/2023    1:18 PM 03/08/2023    8:21 AM  Depression screen PHQ 2/9  Decreased Interest 0 0 0 0 0  Down, Depressed, Hopeless 0 0 0 0 0  PHQ - 2 Score 0 0 0 0 0  Altered sleeping 1   0 0  Tired, decreased energy 1   1 0  Change in appetite 0   0 0  Feeling bad or failure about yourself  0   0 0  Trouble concentrating 0   0 0  Moving slowly or fidgety/restless 0   0 0  Suicidal thoughts 0   0 0  PHQ-9 Score 2   1  0   Difficult doing work/chores Not difficult at all   Not difficult at all Not difficult at all     Data saved with a previous flowsheet row definition      08/04/2024   10:03 AM 06/25/2023    1:18 PM 03/08/2023    8:21 AM  GAD 7 : Generalized Anxiety Score  Nervous, Anxious, on Edge 1 1 0  Control/stop worrying 1 0 0  Worry too much - different things 1 1 0  Trouble relaxing 0 0 0  Restless 0 0 0  Easily annoyed or irritable 0 0 0  Afraid - awful might happen 0 0 0  Total GAD 7 Score 3 2 0  Anxiety Difficulty Not difficult at all Somewhat difficult Not difficult at all     Results for orders placed or performed in visit on 06/16/24  Cologuard   Collection Time: 06/07/24 12:00 AM  Result Value Ref Range   Cologuard Negative Negative    Assessment & Plan:  Assessment and Plan  Annual Physical Exam  - CBC, CMP, Lipid panel    Chronic low back pain  Chronic low back pain with right knee and foot pain, possibly due to sacroiliac joint dysfunction  Meloxicam  provides partial relief. - Advised patient of chiropractic care for potential sacroiliac joint dysfunction. - Continue meloxicam  as needed. - Consider alternating ice and heat for pain relief. - Consider orthopedic referral if no improvement with chiropractic care.  Migraine without aura with status migrainosus - Refilled  rizatriptan  with two refills. - Continue meloxicam  with rizatriptan  as needed.  General Health Maintenance Health maintenance up to date with screenings and vaccinations. Maintains regular exercise and healthy diet. - Ordered blood work today. - Obtain vaccination records from [facility]. - Repeat physical exam in one year.       Orders Placed This Encounter  Procedures   CBC with Differential/Platelet   Comprehensive metabolic panel with GFR   Lipid panel    PATIENT COUNSELING:  - Encouraged a healthy well-balanced diet. Patient may adjust caloric intake to maintain or achieve ideal body weight. May reduce intake of dietary saturated fat and total fat and have adequate dietary potassium and calcium preferably from fresh fruits, vegetables, and low-fat dairy products.   - Advised to avoid cigarette smoking. - Discussed with the patient that most people either abstain from alcohol or drink within safe limits (<=14/week and <=4 drinks/occasion for males, <=7/weeks and <= 3 drinks/occasion for females) and that the risk for alcohol disorders and other health effects rises proportionally with the number of drinks per week and how often a drinker exceeds daily limits. - Discussed cessation/primary prevention of drug use and availability of treatment for abuse.  - Discussed sexually transmitted diseases, avoidance of unintended pregnancy  and contraceptive alternatives.  - Stressed the importance of regular exercise - Injury prevention: Discussed safety belts, safety helmets, smoke detector, smoking near bedding or upholstery.  - Dental health: Discussed importance of regular tooth brushing, flossing, and dental visits.   NEXT PREVENTATIVE PHYSICAL DUE IN 1 YEAR.  Return in about 1 year (around 08/04/2025) for Annual Physical Exam with fasting lab work.  Rosina Senters, FNP      [1]  Allergies Allergen Reactions   Vicodin [Hydrocodone-Acetaminophen ] Nausea Only   "

## 2024-08-05 ENCOUNTER — Ambulatory Visit: Payer: Self-pay | Admitting: Internal Medicine

## 2024-08-14 ENCOUNTER — Other Ambulatory Visit: Payer: Self-pay | Admitting: Family

## 2024-08-14 MED ORDER — ATORVASTATIN CALCIUM 10 MG PO TABS: 10.0000 mg | ORAL_TABLET | Freq: Every day | ORAL | 3 refills | Status: AC

## 2024-08-15 NOTE — Telephone Encounter (Signed)
 Spoke with pt letting her know medication was sent to pharmacy.

## 2024-09-11 ENCOUNTER — Other Ambulatory Visit

## 2024-09-12 LAB — T4, FREE: Free T4: 1.1 ng/dL (ref 0.8–1.8)

## 2024-09-12 LAB — TSH: TSH: 0.57 m[IU]/L

## 2024-09-12 LAB — T3, FREE: T3, Free: 4.2 pg/mL (ref 2.3–4.2)

## 2024-09-16 ENCOUNTER — Telehealth: Admitting: "Endocrinology

## 2024-09-16 ENCOUNTER — Encounter: Payer: Self-pay | Admitting: "Endocrinology

## 2024-09-16 VITALS — Ht 64.0 in | Wt 193.0 lb

## 2024-09-16 DIAGNOSIS — E063 Autoimmune thyroiditis: Secondary | ICD-10-CM

## 2024-09-16 MED ORDER — THYROID 60 MG PO TABS: ORAL_TABLET | ORAL | 3 refills | Status: AC

## 2024-09-16 NOTE — Progress Notes (Signed)
 "  The patient reports they are currently: [name]. I spent 5-6 minutes on the video with the patient on the date of service. I spent an additional 2 minutes on pre- and post-visit activities on the date of service.   The patient was physically located in [place]  or a state in which I am permitted to provide care. The patient and/or parent/guardian understood that s/he may incur co-pays and cost sharing, and agreed to the telemedicine visit. The visit was reasonable and appropriate under the circumstances given the patient's presentation at the time.  The patient and/or parent/guardian understands the potential risks and limitations of this mode of treatment (including, but not limited to, the absence of in-person examination) and has agreed to be treated using telemedicine. The patient's/patient's family's questions regarding telemedicine have been answered.   The patient and/or parent/guardian will contact their provider's office for worsening conditions, and seek emergency medical treatment and/or call 911 if the patient deems either necessary.     Outpatient Endocrinology Note Cassandra Birmingham, MD  09/16/24   Cassandra Hughes [birth year] [id number]  Referring Provider: Billy Knee, FNP Primary Care Provider: Billy Knee, FNP Subjective  No chief complaint on file.   Assessment & Plan  Diagnoses and all orders for this visit:  Hashimoto thyroiditis -     TSH -     T4, free -     T3, free  Other orders -     thyroid  (NP THYROID ) 60 MG tablet; NP thyroid  60mg  2 pills 5 days a week and 3 pills 2 days a week.    Cassandra Hughes is currently taking NP thyroid  60 mg 2 pills 5 days a week and 3 pills 2 days a week. Last TSH lab in system was normal 10 mo ago. Educated on thyroid  axis.  Recommend the following: Continue NP thyroid  60mg  2 pills 5 days a week and 3 pills 2 days a week. Advised to take levothyroxine first thing in the morning on empty stomach and wait at least 30  minutes to 1 hour before eating or drinking anything or taking any other medications. Space out levothyroxine by 4 hours from any acid reflux medication/fibrate/iron/calcium /multivitamin. Take nutritional supplements in the evening. Repeat lab before next visit or sooner if symptoms of hyperthyroidism or hypothyroidism develop.  Notify us  immediately in case of significant weight gain or loss. Counseled on compliance and follow up needs.  I have reviewed current medications, nurse's notes, allergies, vital signs, past medical and surgical history, family medical history, and social history for this encounter. Counseled patient on symptoms, examination findings, lab findings, imaging results, treatment decisions and monitoring and prognosis. The patient understood the recommendations and agrees with the treatment plan. All questions regarding treatment plan were fully answered.   Return in about 5 months (around 02/13/2025) for televisit + labs before next visit.   Cassandra Birmingham, MD  09/16/24   I have reviewed current medications, nurse's notes, allergies, vital signs, past medical and surgical history, family medical history, and social history for this encounter. Counseled patient on symptoms, examination findings, lab findings, imaging results, treatment decisions and monitoring and prognosis. The patient understood the recommendations and agrees with the treatment plan. All questions regarding treatment plan were fully answered.   History of Present Illness Cassandra Hughes is a 54 y.o. year old female who presents to our clinic with hypothyroidism diagnosed around 2010.  Symptoms suggestive of HYPOTHYROIDISM:  fatigue Yes weight gain No cold intolerance  No constipation  No  Symptoms suggestive of HYPERTHYROIDISM:  weight loss  No heat intolerance No hyperdefecation  No palpitations  No  Compressive symptoms:  dysphagia  No dysphonia  No positional dyspnea (especially with  simultaneous arms elevation)  No  Smokes  No On biotin  No Personal history of head/neck surgery/irradiation  No             No family history of thyroid  disease  Physical Exam  Ht 5' 4 (1.626 m)   Wt 193 lb (87.5 kg)   BMI 33.13 kg/m  Constitutional: well developed, well nourished Head: normocephalic, atraumatic, no exophthalmos Eyes: sclera anicteric, no redness Neck: no thyromegaly, no thyroid  tenderness; no nodules seen Lungs: normal respiratory effort Neurology: alert and oriented, no fine hand tremor Skin: dry, no appreciable rashes Musculoskeletal: no appreciable defects Psychiatric: normal mood and affect  Allergies Allergies  Allergen Reactions   Vicodin [Hydrocodone-Acetaminophen ] Nausea Only    Current Medications Patient's Medications  New Prescriptions   No medications on file  Previous Medications   ATORVASTATIN  (LIPITOR) 10 MG TABLET    Take 1 tablet (10 mg total) by mouth daily.   CHOLECALCIFEROL (VITAMIN D3) 1.25 MG (50000 UT) CAPS    Take 1 capsule (1.25 mg total) by mouth once a week.   MELOXICAM  (MOBIC ) 15 MG TABLET    TAKE 1 TABLET BY MOUTH DAILY   RIZATRIPTAN  (MAXALT ) 10 MG TABLET    TAKE 1 TABLET BY MOUTH AT ONSET OF MIGRAINE; MAY REPEAT 1 TABLET IN 2 HOURS IF NEEDED.  Modified Medications   Modified Medication Previous Medication   THYROID  (NP THYROID ) 60 MG TABLET thyroid  (NP THYROID ) 60 MG tablet      NP thyroid  60mg  2 pills 5 days a week and 3 pills 2 days a week.    NP thyroid  60mg  2 pills 5 days a week and 3 pills 2 days a week.  Discontinued Medications   No medications on file    Past Medical History Past Medical History:  Diagnosis Date   Anxiety    Migraines    Thyroid  disease     Past Surgical History Past Surgical History:  Procedure Laterality Date   CESAREAN SECTION     CHOLECYSTECTOMY  2016   CHOLECYSTECTOMY, LAPAROSCOPIC  01/2016   CYST REMOVAL NECK     PELVIC LAPAROSCOPY     fibroid    SKIN CANCER EXCISION N/A     TONSILLECTOMY AND ADENOIDECTOMY      Family History family history includes Cancer in her father, maternal grandmother, and mother; Heart disease in her sister; Migraines in her sister.  Social History Social History   Socioeconomic History   Marital status: Single    Spouse name: Not on file   Number of children: Not on file   Years of education: Not on file   Highest education level: Associate degree: occupational, scientist, product/process development, or vocational program  Occupational History   Not on file  Tobacco Use   Smoking status: Former    Current packs/day: 0.00    Types: Cigarettes   Smokeless tobacco: Never   Tobacco comments:    As a teen/ young adult i smoked not regularly and quit in my late 29s  Vaping Use   Vaping status: Never Used  Substance and Sexual Activity   Alcohol use: Yes    Alcohol/week: 2.0 standard drinks of alcohol    Types: 1 Cans of beer, 1 Standard drinks or equivalent per week    Comment: rarely   Drug  use: Never   Sexual activity: Not Currently    Birth control/protection: None    Comment: First IC >16 y/o, 5-7 Partners, Hx of +TV  Other Topics Concern   Not on file  Social History Narrative   Not on file   Social Drivers of Health   Tobacco Use: Medium Risk (09/16/2024)   Patient History    Smoking Tobacco Use: Former    Smokeless Tobacco Use: Never    Passive Exposure: Not on file  Financial Resource Strain: Low Risk (07/31/2024)   Overall Financial Resource Strain (CARDIA)    Difficulty of Paying Living Expenses: Not very hard  Food Insecurity: No Food Insecurity (07/31/2024)   Epic    Worried About Radiation Protection Practitioner of Food in the Last Year: Never true    Ran Out of Food in the Last Year: Never true  Transportation Needs: No Transportation Needs (07/31/2024)   Epic    Lack of Transportation (Medical): No    Lack of Transportation (Non-Medical): No  Physical Activity: Insufficiently Active (07/31/2024)   Exercise Vital Sign    Days of Exercise per  Week: 3 days    Minutes of Exercise per Session: 20 min  Stress: No Stress Concern Present (07/31/2024)   [employer] of Occupational Health - Occupational Stress Questionnaire    Feeling of Stress: Only a little  Social Connections: Socially Isolated (07/31/2024)   Social Connection and Isolation Panel    Frequency of Communication with Friends and Family: Once a week    Frequency of Social Gatherings with Friends and Family: Once a week    Attends Religious Services: Never    Database Administrator or Organizations: No    Attends Engineer, Structural: Not on file    Marital Status: Never married  Intimate Partner Violence: Unknown (11/18/2021)   Received from [facility]   HITS    Physically Hurt: Not on file    Insult or Talk Down To: Not on file    Threaten Physical Harm: Not on file    Scream or Curse: Not on file  Depression (PHQ2-9): Low Risk (08/04/2024)   Depression (PHQ2-9)    PHQ-2 Score: 2  Alcohol Screen: Low Risk (07/31/2024)   Alcohol Screen    Last Alcohol Screening Score (AUDIT): 2  Housing: Low Risk (07/31/2024)   Epic    Unable to Pay for Housing in the Last Year: No    Number of Times Moved in the Last Year: 0    Homeless in the Last Year: No  Utilities: Not on file  Health Literacy: Not on file    Laboratory Investigations Lab Results  Component Value Date   TSH 0.57 09/11/2024   TSH 0.80 05/13/2024   TSH 1.16 06/25/2023   FREET4 1.1 09/11/2024   FREET4 0.8 05/13/2024     No results found for: TSI   No components found for: TRAB   Lab Results  Component Value Date   CHOL 226 (H) 08/04/2024   Lab Results  Component Value Date   HDL 51.60 08/04/2024   Lab Results  Component Value Date   LDLCALC 148 (H) 08/04/2024   Lab Results  Component Value Date   TRIG 132.0 08/04/2024   Lab Results  Component Value Date   CHOLHDL 4 08/04/2024   Lab Results  Component Value Date   CREATININE 0.72 08/04/2024   Lab Results   Component Value Date   GFR 95.34 08/04/2024      Component Value  Date/Time   NA 137 08/04/2024 1051   K 4.2 08/04/2024 1051   CL 103 08/04/2024 1051   CO2 28 08/04/2024 1051   GLUCOSE 94 08/04/2024 1051   BUN 9 08/04/2024 1051   CREATININE 0.72 08/04/2024 1051   CREATININE 0.76 08/28/2022 0819   CALCIUM  9.1 08/04/2024 1051   PROT 6.7 08/04/2024 1051   ALBUMIN 4.4 08/04/2024 1051   AST 23 08/04/2024 1051   ALT 22 08/04/2024 1051   ALKPHOS 93 08/04/2024 1051   BILITOT 0.4 08/04/2024 1051      Latest Ref Rng & Units 08/04/2024   10:51 AM 12/26/2023    2:48 PM 06/25/2023    1:44 PM  BMP  Glucose 70 - 99 mg/dL 94  88  79   BUN 6 - 23 mg/dL 9  18  10    Creatinine 0.40 - 1.20 mg/dL 9.27  9.25  9.23   Sodium 135 - 145 mEq/L 137  138  138   Potassium 3.5 - 5.1 mEq/L 4.2  4.0  4.0   Chloride 96 - 112 mEq/L 103  104  103   CO2 19 - 32 mEq/L 28  27  26    Calcium  8.4 - 10.5 mg/dL 9.1  9.1  9.2        Component Value Date/Time   WBC 5.2 08/04/2024 1051   RBC 4.69 08/04/2024 1051   HGB 14.3 08/04/2024 1051   HCT 41.3 08/04/2024 1051   PLT 213.0 08/04/2024 1051   MCV 88.0 08/04/2024 1051   MCH 30.6 08/28/2022 0819   MCHC 34.6 08/04/2024 1051   RDW 11.9 08/04/2024 1051   LYMPHSABS 2.0 08/04/2024 1051   MONOABS 0.3 08/04/2024 1051   EOSABS 0.1 08/04/2024 1051   BASOSABS 0.1 08/04/2024 1051      Parts of this note may have been dictated using voice recognition software. There may be variances in spelling and vocabulary which are unintentional. Not all errors are proofread. Please notify the dino if any discrepancies are noted or if the meaning of any statement is not clear.    "

## 2024-11-13 ENCOUNTER — Ambulatory Visit: Admitting: Nurse Practitioner

## 2025-08-05 ENCOUNTER — Encounter: Admitting: Internal Medicine
